# Patient Record
Sex: Female | Born: 1945
Health system: Southern US, Community
[De-identification: ages and names within clinical notes are randomized; demographics above are authoritative.]

## PROBLEM LIST (undated history)

## (undated) DIAGNOSIS — F32A Depression, unspecified: Secondary | ICD-10-CM

## (undated) DIAGNOSIS — H3530 Unspecified macular degeneration: Secondary | ICD-10-CM

## (undated) DIAGNOSIS — F99 Mental disorder, not otherwise specified: Secondary | ICD-10-CM

## (undated) DIAGNOSIS — E119 Type 2 diabetes mellitus without complications: Secondary | ICD-10-CM

## (undated) DIAGNOSIS — H409 Unspecified glaucoma: Secondary | ICD-10-CM

## (undated) DIAGNOSIS — I10 Essential (primary) hypertension: Secondary | ICD-10-CM

## (undated) DIAGNOSIS — F329 Major depressive disorder, single episode, unspecified: Secondary | ICD-10-CM

## (undated) DIAGNOSIS — E785 Hyperlipidemia, unspecified: Secondary | ICD-10-CM

## (undated) HISTORY — PX: APPENDECTOMY: SHX54

## (undated) HISTORY — PX: TONSILLECTOMY: SUR1361

## (undated) HISTORY — PX: EYE SURGERY: SHX253

---

## 1998-01-15 ENCOUNTER — Encounter: Admission: RE | Admit: 1998-01-15 | Discharge: 1998-04-15 | Payer: Self-pay | Admitting: Family Medicine

## 1998-01-22 ENCOUNTER — Ambulatory Visit (HOSPITAL_COMMUNITY): Admission: RE | Admit: 1998-01-22 | Discharge: 1998-01-22 | Payer: Self-pay | Admitting: Gastroenterology

## 1998-12-11 ENCOUNTER — Other Ambulatory Visit: Admission: RE | Admit: 1998-12-11 | Discharge: 1998-12-11 | Payer: Self-pay | Admitting: Obstetrics and Gynecology

## 1999-11-27 ENCOUNTER — Encounter: Admission: RE | Admit: 1999-11-27 | Discharge: 1999-11-27 | Payer: Self-pay | Admitting: Family Medicine

## 1999-11-27 ENCOUNTER — Encounter: Payer: Self-pay | Admitting: Family Medicine

## 1999-12-29 ENCOUNTER — Other Ambulatory Visit: Admission: RE | Admit: 1999-12-29 | Discharge: 1999-12-29 | Payer: Self-pay | Admitting: Obstetrics and Gynecology

## 2000-12-16 ENCOUNTER — Other Ambulatory Visit: Admission: RE | Admit: 2000-12-16 | Discharge: 2000-12-16 | Payer: Self-pay | Admitting: Obstetrics and Gynecology

## 2001-02-01 ENCOUNTER — Ambulatory Visit (HOSPITAL_COMMUNITY): Admission: RE | Admit: 2001-02-01 | Discharge: 2001-02-01 | Payer: Self-pay | Admitting: Gastroenterology

## 2001-02-01 ENCOUNTER — Encounter (INDEPENDENT_AMBULATORY_CARE_PROVIDER_SITE_OTHER): Payer: Self-pay | Admitting: Specialist

## 2001-12-21 ENCOUNTER — Other Ambulatory Visit: Admission: RE | Admit: 2001-12-21 | Discharge: 2001-12-21 | Payer: Self-pay | Admitting: Obstetrics and Gynecology

## 2004-08-28 ENCOUNTER — Other Ambulatory Visit: Admission: RE | Admit: 2004-08-28 | Discharge: 2004-08-28 | Payer: Self-pay | Admitting: Obstetrics and Gynecology

## 2006-04-15 ENCOUNTER — Encounter: Admission: RE | Admit: 2006-04-15 | Discharge: 2006-04-15 | Payer: Self-pay | Admitting: Family Medicine

## 2007-04-21 HISTORY — PX: TOTAL VAGINAL HYSTERECTOMY: SHX2548

## 2007-04-21 HISTORY — PX: VAGINAL HYSTERECTOMY: SUR661

## 2007-09-14 ENCOUNTER — Encounter: Admission: RE | Admit: 2007-09-14 | Discharge: 2007-12-13 | Payer: Self-pay | Admitting: Ophthalmology

## 2008-03-29 ENCOUNTER — Ambulatory Visit (HOSPITAL_COMMUNITY): Admission: RE | Admit: 2008-03-29 | Discharge: 2008-03-29 | Payer: Self-pay | Admitting: Obstetrics and Gynecology

## 2008-05-30 ENCOUNTER — Ambulatory Visit (HOSPITAL_COMMUNITY): Admission: RE | Admit: 2008-05-30 | Discharge: 2008-05-30 | Payer: Self-pay | Admitting: Family Medicine

## 2008-06-06 ENCOUNTER — Encounter: Admission: RE | Admit: 2008-06-06 | Discharge: 2008-06-06 | Payer: Self-pay | Admitting: Family Medicine

## 2010-09-02 NOTE — H&P (Signed)
NAME:  [name], [name]                 ACCOUNT NO.:  [id number]   MEDICAL RECORD NO.:  [id number]        PATIENT TYPE:  WAMB   LOCATION:                                FACILITY:  [facility]   PHYSICIAN:  Erika Salvia. Marcelle Robertson, M.D.DATE OF BIRTH:  [birth year]   DATE OF ADMISSION:  03/29/2008  DATE OF DISCHARGE:                              HISTORY & PHYSICAL   Date of scheduled surgery is 12/10.   CHIEF COMPLAINT:  SUI.   HISTORY OF PRESENT ILLNESS:  A 65 year old G3, P3.  This patient has had  a prior hysterectomy, has a 6-12 months history of urinary incontinence.  Recent urodynamics and hour study showed a residual of 10 mL.  She did  leak with a full bladder with Valsalva.  LPP 92-128 with an MUCP 30-32.  No uninhibited contractions and her uroflow pattern without the  catheters was normal.  Presents now for TOT and cystoscopy.  This  procedure including risks related to bleeding, infection, and adjacent  organ injury all reviewed with her.  Risks related to mesh erosion, the  need for postop catheterization and bladder irritability in the  immediate postop period all reviewed with her.   MEDICATIONS:  Lexapro 20 mg daily, Crestor, Xanax p.r.n., metformin.   Her medical doctor is Dr. Clovis Robertson.   REVIEW OF SYSTEMS:  Significant for a history of smoking.  She has been  stopped several years.   SURGERIES:  She has had tonsillectomy, appendectomy and hysterectomy 30  years ago.   Three vaginal deliveries at term.   ALLERGIES:  Sulfa and penicillin.   PHYSICAL EXAMINATION:  Temperature 98.2, blood pressure 120/83.  HEENT: Unremarkable.  NECK:  Supple without masses.  LUNGS:  Clear.  CARDIOVASCULAR:  Rate and rhythm without murmurs, rubs or gallops.  BREASTS:  Without masses.  ABDOMEN:  Soft, flat, nontender.  PELVIC EXAM:  Normal external genitalia.  Vaginal cuff clear.  Bimanual  negative.  EXTREMITIES AND  NEUROLOGIC EXAM:  Unremarkable.   IMPRESSION:  SUI.   PLAN:  TOT with  cystoscopy.  Procedure and risks reviewed as above.      [name], M.D.  Electronically Signed     RMH/MEDQ  D:  03/28/2008  T:  03/28/2008  Job:  [id number]

## 2010-09-02 NOTE — Op Note (Signed)
NAME:  [name], [name]                 ACCOUNT NO.:  [id number]   MEDICAL RECORD NO.:  [id number]          PATIENT TYPE:  AMB   LOCATION:  SDC                           FACILITY:  [facility]   PHYSICIAN:  Duke Salvia. Marcelle Overlie, M.D.DATE OF BIRTH:  1946/03/01   DATE OF PROCEDURE:  DATE OF DISCHARGE:                               OPERATIVE REPORT   PREOPERATIVE DIAGNOSIS:  Stress urinary incontinence.   POSTOPERATIVE DIAGNOSIS:  Stress urinary incontinence.   PROCEDURES:  1. Transobturator mid urethral sling.  2. Cystoscopy.   SURGEON:  Duke Salvia. Marcelle Overlie, MD   ANESTHESIA:  General.   COMPLICATIONS:  None.   DRAINS:  Foley catheter.   BLOOD LOSS:  Less than 50 mL.   SPECIMENS:  None.   PROCEDURE AND FINDINGS:  The patient was taken to the operating room  after an adequate level of general anesthesia was obtained.  With the  patient's legs in stirrups, the inner thighs, perineum, and vagina were  prepped and draped in usual manner for vaginal procedures.  The bladder  was drained.  A weighted speculum was positioned.  The needle sites for  the TOT were marked at the level of the clitoris just at the medial edge  of the inferior pubic ramus.  After these sites were marked, 1%  Xylocaine with epi was used to infiltrate the vaginal mucosa at the mid  urethral junction.  A 2-cm vertical incision was made, was dissected  minimally with sharp and blunt dissection until I could palpate the  posterior margin of the inferior pubic ramus on each side.  The  transobturator needle was then passed on each side.  Once the needles  were passed, cystoscopy was carried out.  The bladder was intact.  There  was a good ureteral jet on both sides.  No evidence of any bleeding or  trauma.  The scope was removed.  The bladder was deflated with the  catheter.  The mesh assembly was pulled through, was tensioned  appropriately, and trimmed.  The vaginal mucosa was closed with interrupted 2-0 Vicryl sutures.  Prior to closure, careful attention by observation to make sure there  was no buttonholing of the vaginal mucosa.  The needle sites were closed  with Dermabond.  She tolerated this well, went to recovery room in good  condition.      Richard M. Marcelle Overlie, M.D.  Electronically Signed     RMH/MEDQ  D:  03/29/2008  T:  03/29/2008  Job:  [id number]

## 2010-09-05 NOTE — Procedures (Signed)
[facility]  Patient:    [name], [name] Visit Number: [id number] MRN: [id number]          Service Type: END Location: ENDO Attending Physician:  Nelda Marseille Proc. Date: 02/01/01 Admit Date:  02/01/2001   CC:         Melvyn Neth D. Clovis Riley, MD   Procedure Report  PROCEDURE:  Colonoscopy with biopsy.  INDICATIONS:  Family history of colon cancer.  Personal history of colon polyps.  INFORMED CONSENT:  Consent was signed after risk, benefits, methods and options were thoroughly discussed in the past.  MEDICINES USED:  Demerol 70 mg, Versed 7.5 mg.  DESCRIPTION OF PROCEDURE:  Rectal inspection was pertinent for external hemorrhoids.  Digital exam was negative.  The video colonoscope was inserted and easily advanced around the colon to the cecum.  This did not require abdominal pressure or any position changes.  The cecum was identified by the appendiceal orifice and the ileocecal valve.  A tiny cecal polyp was seen and was cold biopsied x 3 in an effort to completely remove it.  The scope was further withdrawn.  Lots of washing and suctioning was done to get adequate visualization, but no other abnormalities were seen as we slowly withdrew back to the rectum.  Once back in the rectum, the scope was retroflexed, pertinent for some internal hemorrhoids.  The scope was straightened; air was suctioned and the scope removed.  The patient tolerated the procedure well, and there was no other obvious complication.  ENDOSCOPIC DIAGNOSES: 1. Internal/external hemorrhoids. 2. Tiny cecal polyp, cold biopsied. 3. Otherwise within normal limits to the cecum.  PLAN:  Yearly rectals and guaiacs per Dr. Clovis Riley.  Happy to see back p.r.n. but probably recheck screening in five years. Attending Physician:  Nelda Marseille DD:  02/01/01 TD:  02/01/01 Job: [id number]

## 2011-01-23 LAB — COMPREHENSIVE METABOLIC PANEL
ALT: 22 U/L (ref 0–35)
AST: 24 U/L (ref 0–37)
Albumin: 3.6 g/dL (ref 3.5–5.2)
Alkaline Phosphatase: 54 U/L (ref 39–117)
BUN: 11 mg/dL (ref 6–23)
CO2: 30 mEq/L (ref 19–32)
Calcium: 9.3 mg/dL (ref 8.4–10.5)
Chloride: 104 mEq/L (ref 96–112)
Creatinine, Ser: 0.56 mg/dL (ref 0.4–1.2)
GFR calc Af Amer: >60 mL/min (ref >60)
GFR calc non Af Amer: >60 mL/min (ref >60)
Glucose, Bld: 129 mg/dL — ABNORMAL HIGH (ref 70–99)
Potassium: 4.1 mEq/L (ref 3.5–5.1)
Sodium: 140 mEq/L (ref 135–145)
Total Bilirubin: 0.7 mg/dL (ref 0.3–1.2)
Total Protein: 6.6 g/dL (ref 6.0–8.3)

## 2011-01-23 LAB — CBC
HCT: 38.5 % (ref 36.0–46.0)
Hemoglobin: 13.1 g/dL (ref 12.0–15.0)
MCHC: 34 g/dL (ref 30.0–36.0)
MCV: 93.1 fL (ref 78.0–100.0)
Platelets: 226 10*3/uL (ref 150–400)
RBC: 4.13 MIL/uL (ref 3.87–5.11)
RDW: 13.4 % (ref 11.5–15.5)
WBC: 7.1 10*3/uL (ref 4.0–10.5)

## 2011-01-23 LAB — GLUCOSE, CAPILLARY: Glucose-Capillary: 196 mg/dL — ABNORMAL HIGH (ref 70–99)

## 2011-07-08 DIAGNOSIS — Z09 Encounter for follow-up examination after completed treatment for conditions other than malignant neoplasm: Secondary | ICD-10-CM | POA: Diagnosis not present

## 2011-07-08 DIAGNOSIS — D126 Benign neoplasm of colon, unspecified: Secondary | ICD-10-CM | POA: Diagnosis not present

## 2011-07-08 DIAGNOSIS — K591 Functional diarrhea: Secondary | ICD-10-CM | POA: Diagnosis not present

## 2011-07-08 DIAGNOSIS — Z86010 Personal history of colonic polyps: Secondary | ICD-10-CM | POA: Diagnosis not present

## 2011-07-08 DIAGNOSIS — Z800 Family history of malignant neoplasm of digestive organs: Secondary | ICD-10-CM | POA: Diagnosis not present

## 2011-07-09 DIAGNOSIS — D126 Benign neoplasm of colon, unspecified: Secondary | ICD-10-CM | POA: Diagnosis not present

## 2011-07-09 DIAGNOSIS — K591 Functional diarrhea: Secondary | ICD-10-CM | POA: Diagnosis not present

## 2011-07-14 DIAGNOSIS — E119 Type 2 diabetes mellitus without complications: Secondary | ICD-10-CM | POA: Diagnosis not present

## 2011-07-14 DIAGNOSIS — H353290 Exudative age-related macular degeneration, unspecified eye, stage unspecified: Secondary | ICD-10-CM | POA: Diagnosis not present

## 2011-07-28 DIAGNOSIS — H353290 Exudative age-related macular degeneration, unspecified eye, stage unspecified: Secondary | ICD-10-CM | POA: Diagnosis not present

## 2011-07-28 DIAGNOSIS — H35059 Retinal neovascularization, unspecified, unspecified eye: Secondary | ICD-10-CM | POA: Diagnosis not present

## 2011-08-24 DIAGNOSIS — H35059 Retinal neovascularization, unspecified, unspecified eye: Secondary | ICD-10-CM | POA: Diagnosis not present

## 2011-08-24 DIAGNOSIS — H409 Unspecified glaucoma: Secondary | ICD-10-CM | POA: Diagnosis not present

## 2011-08-24 DIAGNOSIS — H4011X0 Primary open-angle glaucoma, stage unspecified: Secondary | ICD-10-CM | POA: Diagnosis not present

## 2011-08-31 DIAGNOSIS — E669 Obesity, unspecified: Secondary | ICD-10-CM | POA: Diagnosis not present

## 2011-08-31 DIAGNOSIS — I10 Essential (primary) hypertension: Secondary | ICD-10-CM | POA: Diagnosis not present

## 2011-08-31 DIAGNOSIS — M159 Polyosteoarthritis, unspecified: Secondary | ICD-10-CM | POA: Diagnosis not present

## 2011-08-31 DIAGNOSIS — F329 Major depressive disorder, single episode, unspecified: Secondary | ICD-10-CM | POA: Diagnosis not present

## 2011-08-31 DIAGNOSIS — E7800 Pure hypercholesterolemia, unspecified: Secondary | ICD-10-CM | POA: Diagnosis not present

## 2011-08-31 DIAGNOSIS — E119 Type 2 diabetes mellitus without complications: Secondary | ICD-10-CM | POA: Diagnosis not present

## 2011-10-08 DIAGNOSIS — E119 Type 2 diabetes mellitus without complications: Secondary | ICD-10-CM | POA: Diagnosis not present

## 2011-10-08 DIAGNOSIS — F329 Major depressive disorder, single episode, unspecified: Secondary | ICD-10-CM | POA: Diagnosis not present

## 2011-10-08 DIAGNOSIS — H409 Unspecified glaucoma: Secondary | ICD-10-CM | POA: Diagnosis not present

## 2011-10-08 DIAGNOSIS — H353290 Exudative age-related macular degeneration, unspecified eye, stage unspecified: Secondary | ICD-10-CM | POA: Diagnosis not present

## 2011-10-08 DIAGNOSIS — F411 Generalized anxiety disorder: Secondary | ICD-10-CM | POA: Diagnosis not present

## 2011-10-08 DIAGNOSIS — D485 Neoplasm of uncertain behavior of skin: Secondary | ICD-10-CM | POA: Diagnosis not present

## 2011-10-08 DIAGNOSIS — M25519 Pain in unspecified shoulder: Secondary | ICD-10-CM | POA: Diagnosis not present

## 2011-10-08 DIAGNOSIS — I10 Essential (primary) hypertension: Secondary | ICD-10-CM | POA: Diagnosis not present

## 2011-10-12 DIAGNOSIS — F411 Generalized anxiety disorder: Secondary | ICD-10-CM | POA: Diagnosis not present

## 2011-10-12 DIAGNOSIS — H353290 Exudative age-related macular degeneration, unspecified eye, stage unspecified: Secondary | ICD-10-CM | POA: Diagnosis not present

## 2011-10-12 DIAGNOSIS — F329 Major depressive disorder, single episode, unspecified: Secondary | ICD-10-CM | POA: Diagnosis not present

## 2011-10-12 DIAGNOSIS — I10 Essential (primary) hypertension: Secondary | ICD-10-CM | POA: Diagnosis not present

## 2011-10-12 DIAGNOSIS — E119 Type 2 diabetes mellitus without complications: Secondary | ICD-10-CM | POA: Diagnosis not present

## 2011-10-12 DIAGNOSIS — H409 Unspecified glaucoma: Secondary | ICD-10-CM | POA: Diagnosis not present

## 2011-10-13 DIAGNOSIS — H353290 Exudative age-related macular degeneration, unspecified eye, stage unspecified: Secondary | ICD-10-CM | POA: Diagnosis not present

## 2011-10-13 DIAGNOSIS — H35359 Cystoid macular degeneration, unspecified eye: Secondary | ICD-10-CM | POA: Diagnosis not present

## 2011-10-13 DIAGNOSIS — H35059 Retinal neovascularization, unspecified, unspecified eye: Secondary | ICD-10-CM | POA: Diagnosis not present

## 2011-10-16 DIAGNOSIS — H353290 Exudative age-related macular degeneration, unspecified eye, stage unspecified: Secondary | ICD-10-CM | POA: Diagnosis not present

## 2011-10-16 DIAGNOSIS — E119 Type 2 diabetes mellitus without complications: Secondary | ICD-10-CM | POA: Diagnosis not present

## 2011-10-16 DIAGNOSIS — I10 Essential (primary) hypertension: Secondary | ICD-10-CM | POA: Diagnosis not present

## 2011-10-16 DIAGNOSIS — F329 Major depressive disorder, single episode, unspecified: Secondary | ICD-10-CM | POA: Diagnosis not present

## 2011-10-16 DIAGNOSIS — F411 Generalized anxiety disorder: Secondary | ICD-10-CM | POA: Diagnosis not present

## 2011-10-16 DIAGNOSIS — H409 Unspecified glaucoma: Secondary | ICD-10-CM | POA: Diagnosis not present

## 2011-10-19 DIAGNOSIS — E119 Type 2 diabetes mellitus without complications: Secondary | ICD-10-CM | POA: Diagnosis not present

## 2011-10-19 DIAGNOSIS — F329 Major depressive disorder, single episode, unspecified: Secondary | ICD-10-CM | POA: Diagnosis not present

## 2011-10-19 DIAGNOSIS — H409 Unspecified glaucoma: Secondary | ICD-10-CM | POA: Diagnosis not present

## 2011-10-19 DIAGNOSIS — I10 Essential (primary) hypertension: Secondary | ICD-10-CM | POA: Diagnosis not present

## 2011-10-19 DIAGNOSIS — F411 Generalized anxiety disorder: Secondary | ICD-10-CM | POA: Diagnosis not present

## 2011-10-19 DIAGNOSIS — H353290 Exudative age-related macular degeneration, unspecified eye, stage unspecified: Secondary | ICD-10-CM | POA: Diagnosis not present

## 2011-10-20 DIAGNOSIS — F329 Major depressive disorder, single episode, unspecified: Secondary | ICD-10-CM | POA: Diagnosis not present

## 2011-10-20 DIAGNOSIS — H409 Unspecified glaucoma: Secondary | ICD-10-CM | POA: Diagnosis not present

## 2011-10-20 DIAGNOSIS — E119 Type 2 diabetes mellitus without complications: Secondary | ICD-10-CM | POA: Diagnosis not present

## 2011-10-20 DIAGNOSIS — F411 Generalized anxiety disorder: Secondary | ICD-10-CM | POA: Diagnosis not present

## 2011-10-20 DIAGNOSIS — H353290 Exudative age-related macular degeneration, unspecified eye, stage unspecified: Secondary | ICD-10-CM | POA: Diagnosis not present

## 2011-10-20 DIAGNOSIS — I10 Essential (primary) hypertension: Secondary | ICD-10-CM | POA: Diagnosis not present

## 2011-10-21 DIAGNOSIS — F411 Generalized anxiety disorder: Secondary | ICD-10-CM | POA: Diagnosis not present

## 2011-10-21 DIAGNOSIS — I10 Essential (primary) hypertension: Secondary | ICD-10-CM | POA: Diagnosis not present

## 2011-10-21 DIAGNOSIS — H409 Unspecified glaucoma: Secondary | ICD-10-CM | POA: Diagnosis not present

## 2011-10-21 DIAGNOSIS — H353290 Exudative age-related macular degeneration, unspecified eye, stage unspecified: Secondary | ICD-10-CM | POA: Diagnosis not present

## 2011-10-21 DIAGNOSIS — E119 Type 2 diabetes mellitus without complications: Secondary | ICD-10-CM | POA: Diagnosis not present

## 2011-10-21 DIAGNOSIS — F329 Major depressive disorder, single episode, unspecified: Secondary | ICD-10-CM | POA: Diagnosis not present

## 2011-10-27 DIAGNOSIS — E119 Type 2 diabetes mellitus without complications: Secondary | ICD-10-CM | POA: Diagnosis not present

## 2011-10-27 DIAGNOSIS — F329 Major depressive disorder, single episode, unspecified: Secondary | ICD-10-CM | POA: Diagnosis not present

## 2011-10-27 DIAGNOSIS — I10 Essential (primary) hypertension: Secondary | ICD-10-CM | POA: Diagnosis not present

## 2011-10-27 DIAGNOSIS — H353290 Exudative age-related macular degeneration, unspecified eye, stage unspecified: Secondary | ICD-10-CM | POA: Diagnosis not present

## 2011-10-27 DIAGNOSIS — F411 Generalized anxiety disorder: Secondary | ICD-10-CM | POA: Diagnosis not present

## 2011-10-27 DIAGNOSIS — H409 Unspecified glaucoma: Secondary | ICD-10-CM | POA: Diagnosis not present

## 2011-10-29 DIAGNOSIS — H409 Unspecified glaucoma: Secondary | ICD-10-CM | POA: Diagnosis not present

## 2011-10-29 DIAGNOSIS — E119 Type 2 diabetes mellitus without complications: Secondary | ICD-10-CM | POA: Diagnosis not present

## 2011-10-29 DIAGNOSIS — H353290 Exudative age-related macular degeneration, unspecified eye, stage unspecified: Secondary | ICD-10-CM | POA: Diagnosis not present

## 2011-10-29 DIAGNOSIS — F329 Major depressive disorder, single episode, unspecified: Secondary | ICD-10-CM | POA: Diagnosis not present

## 2011-10-29 DIAGNOSIS — F411 Generalized anxiety disorder: Secondary | ICD-10-CM | POA: Diagnosis not present

## 2011-10-29 DIAGNOSIS — I10 Essential (primary) hypertension: Secondary | ICD-10-CM | POA: Diagnosis not present

## 2011-11-02 DIAGNOSIS — H409 Unspecified glaucoma: Secondary | ICD-10-CM | POA: Diagnosis not present

## 2011-11-02 DIAGNOSIS — I10 Essential (primary) hypertension: Secondary | ICD-10-CM | POA: Diagnosis not present

## 2011-11-02 DIAGNOSIS — F411 Generalized anxiety disorder: Secondary | ICD-10-CM | POA: Diagnosis not present

## 2011-11-02 DIAGNOSIS — E119 Type 2 diabetes mellitus without complications: Secondary | ICD-10-CM | POA: Diagnosis not present

## 2011-11-02 DIAGNOSIS — H353290 Exudative age-related macular degeneration, unspecified eye, stage unspecified: Secondary | ICD-10-CM | POA: Diagnosis not present

## 2011-11-02 DIAGNOSIS — F329 Major depressive disorder, single episode, unspecified: Secondary | ICD-10-CM | POA: Diagnosis not present

## 2011-11-04 DIAGNOSIS — E119 Type 2 diabetes mellitus without complications: Secondary | ICD-10-CM | POA: Diagnosis not present

## 2011-11-04 DIAGNOSIS — H353290 Exudative age-related macular degeneration, unspecified eye, stage unspecified: Secondary | ICD-10-CM | POA: Diagnosis not present

## 2011-11-04 DIAGNOSIS — H409 Unspecified glaucoma: Secondary | ICD-10-CM | POA: Diagnosis not present

## 2011-11-04 DIAGNOSIS — I10 Essential (primary) hypertension: Secondary | ICD-10-CM | POA: Diagnosis not present

## 2011-11-04 DIAGNOSIS — F411 Generalized anxiety disorder: Secondary | ICD-10-CM | POA: Diagnosis not present

## 2011-11-04 DIAGNOSIS — F329 Major depressive disorder, single episode, unspecified: Secondary | ICD-10-CM | POA: Diagnosis not present

## 2011-11-10 DIAGNOSIS — F411 Generalized anxiety disorder: Secondary | ICD-10-CM | POA: Diagnosis not present

## 2011-11-10 DIAGNOSIS — H353290 Exudative age-related macular degeneration, unspecified eye, stage unspecified: Secondary | ICD-10-CM | POA: Diagnosis not present

## 2011-11-10 DIAGNOSIS — F329 Major depressive disorder, single episode, unspecified: Secondary | ICD-10-CM | POA: Diagnosis not present

## 2011-11-10 DIAGNOSIS — H409 Unspecified glaucoma: Secondary | ICD-10-CM | POA: Diagnosis not present

## 2011-11-10 DIAGNOSIS — E119 Type 2 diabetes mellitus without complications: Secondary | ICD-10-CM | POA: Diagnosis not present

## 2011-11-10 DIAGNOSIS — I10 Essential (primary) hypertension: Secondary | ICD-10-CM | POA: Diagnosis not present

## 2011-11-13 DIAGNOSIS — E119 Type 2 diabetes mellitus without complications: Secondary | ICD-10-CM | POA: Diagnosis not present

## 2011-11-13 DIAGNOSIS — H353290 Exudative age-related macular degeneration, unspecified eye, stage unspecified: Secondary | ICD-10-CM | POA: Diagnosis not present

## 2011-11-13 DIAGNOSIS — I10 Essential (primary) hypertension: Secondary | ICD-10-CM | POA: Diagnosis not present

## 2011-11-13 DIAGNOSIS — H409 Unspecified glaucoma: Secondary | ICD-10-CM | POA: Diagnosis not present

## 2011-11-13 DIAGNOSIS — F329 Major depressive disorder, single episode, unspecified: Secondary | ICD-10-CM | POA: Diagnosis not present

## 2011-11-13 DIAGNOSIS — F411 Generalized anxiety disorder: Secondary | ICD-10-CM | POA: Diagnosis not present

## 2011-11-17 DIAGNOSIS — E119 Type 2 diabetes mellitus without complications: Secondary | ICD-10-CM | POA: Diagnosis not present

## 2011-11-17 DIAGNOSIS — I10 Essential (primary) hypertension: Secondary | ICD-10-CM | POA: Diagnosis not present

## 2011-11-17 DIAGNOSIS — H409 Unspecified glaucoma: Secondary | ICD-10-CM | POA: Diagnosis not present

## 2011-11-17 DIAGNOSIS — F329 Major depressive disorder, single episode, unspecified: Secondary | ICD-10-CM | POA: Diagnosis not present

## 2011-11-17 DIAGNOSIS — H353290 Exudative age-related macular degeneration, unspecified eye, stage unspecified: Secondary | ICD-10-CM | POA: Diagnosis not present

## 2011-11-17 DIAGNOSIS — F411 Generalized anxiety disorder: Secondary | ICD-10-CM | POA: Diagnosis not present

## 2011-11-19 DIAGNOSIS — H409 Unspecified glaucoma: Secondary | ICD-10-CM | POA: Diagnosis not present

## 2011-11-19 DIAGNOSIS — F411 Generalized anxiety disorder: Secondary | ICD-10-CM | POA: Diagnosis not present

## 2011-11-19 DIAGNOSIS — F329 Major depressive disorder, single episode, unspecified: Secondary | ICD-10-CM | POA: Diagnosis not present

## 2011-11-19 DIAGNOSIS — I10 Essential (primary) hypertension: Secondary | ICD-10-CM | POA: Diagnosis not present

## 2011-11-19 DIAGNOSIS — E119 Type 2 diabetes mellitus without complications: Secondary | ICD-10-CM | POA: Diagnosis not present

## 2011-11-19 DIAGNOSIS — H353290 Exudative age-related macular degeneration, unspecified eye, stage unspecified: Secondary | ICD-10-CM | POA: Diagnosis not present

## 2011-12-07 DIAGNOSIS — R159 Full incontinence of feces: Secondary | ICD-10-CM | POA: Diagnosis not present

## 2011-12-07 DIAGNOSIS — M25519 Pain in unspecified shoulder: Secondary | ICD-10-CM | POA: Diagnosis not present

## 2011-12-07 DIAGNOSIS — R300 Dysuria: Secondary | ICD-10-CM | POA: Diagnosis not present

## 2011-12-07 DIAGNOSIS — N3000 Acute cystitis without hematuria: Secondary | ICD-10-CM | POA: Diagnosis not present

## 2011-12-07 DIAGNOSIS — I10 Essential (primary) hypertension: Secondary | ICD-10-CM | POA: Diagnosis not present

## 2011-12-11 DIAGNOSIS — M67919 Unspecified disorder of synovium and tendon, unspecified shoulder: Secondary | ICD-10-CM | POA: Diagnosis not present

## 2011-12-11 DIAGNOSIS — M719 Bursopathy, unspecified: Secondary | ICD-10-CM | POA: Diagnosis not present

## 2011-12-16 DIAGNOSIS — M719 Bursopathy, unspecified: Secondary | ICD-10-CM | POA: Diagnosis not present

## 2011-12-16 DIAGNOSIS — M67919 Unspecified disorder of synovium and tendon, unspecified shoulder: Secondary | ICD-10-CM | POA: Diagnosis not present

## 2011-12-17 DIAGNOSIS — M719 Bursopathy, unspecified: Secondary | ICD-10-CM | POA: Diagnosis not present

## 2011-12-17 DIAGNOSIS — M67919 Unspecified disorder of synovium and tendon, unspecified shoulder: Secondary | ICD-10-CM | POA: Diagnosis not present

## 2011-12-18 DIAGNOSIS — H353290 Exudative age-related macular degeneration, unspecified eye, stage unspecified: Secondary | ICD-10-CM | POA: Diagnosis not present

## 2011-12-18 DIAGNOSIS — H35059 Retinal neovascularization, unspecified, unspecified eye: Secondary | ICD-10-CM | POA: Diagnosis not present

## 2011-12-23 DIAGNOSIS — M719 Bursopathy, unspecified: Secondary | ICD-10-CM | POA: Diagnosis not present

## 2011-12-25 DIAGNOSIS — M719 Bursopathy, unspecified: Secondary | ICD-10-CM | POA: Diagnosis not present

## 2011-12-28 DIAGNOSIS — M67919 Unspecified disorder of synovium and tendon, unspecified shoulder: Secondary | ICD-10-CM | POA: Diagnosis not present

## 2011-12-28 DIAGNOSIS — M719 Bursopathy, unspecified: Secondary | ICD-10-CM | POA: Diagnosis not present

## 2011-12-30 DIAGNOSIS — M719 Bursopathy, unspecified: Secondary | ICD-10-CM | POA: Diagnosis not present

## 2011-12-30 DIAGNOSIS — Z23 Encounter for immunization: Secondary | ICD-10-CM | POA: Diagnosis not present

## 2011-12-30 DIAGNOSIS — M67919 Unspecified disorder of synovium and tendon, unspecified shoulder: Secondary | ICD-10-CM | POA: Diagnosis not present

## 2012-01-04 DIAGNOSIS — M67919 Unspecified disorder of synovium and tendon, unspecified shoulder: Secondary | ICD-10-CM | POA: Diagnosis not present

## 2012-01-06 DIAGNOSIS — R159 Full incontinence of feces: Secondary | ICD-10-CM | POA: Diagnosis not present

## 2012-01-06 DIAGNOSIS — K529 Noninfective gastroenteritis and colitis, unspecified: Secondary | ICD-10-CM | POA: Diagnosis not present

## 2012-01-06 DIAGNOSIS — M719 Bursopathy, unspecified: Secondary | ICD-10-CM | POA: Diagnosis not present

## 2012-01-06 DIAGNOSIS — M67919 Unspecified disorder of synovium and tendon, unspecified shoulder: Secondary | ICD-10-CM | POA: Diagnosis not present

## 2012-01-06 DIAGNOSIS — R143 Flatulence: Secondary | ICD-10-CM | POA: Diagnosis not present

## 2012-01-06 DIAGNOSIS — R142 Eructation: Secondary | ICD-10-CM | POA: Diagnosis not present

## 2012-01-08 DIAGNOSIS — H35359 Cystoid macular degeneration, unspecified eye: Secondary | ICD-10-CM | POA: Diagnosis not present

## 2012-01-08 DIAGNOSIS — H4011X0 Primary open-angle glaucoma, stage unspecified: Secondary | ICD-10-CM | POA: Diagnosis not present

## 2012-01-08 DIAGNOSIS — E119 Type 2 diabetes mellitus without complications: Secondary | ICD-10-CM | POA: Diagnosis not present

## 2012-01-08 DIAGNOSIS — H353290 Exudative age-related macular degeneration, unspecified eye, stage unspecified: Secondary | ICD-10-CM | POA: Diagnosis not present

## 2012-01-08 DIAGNOSIS — H35059 Retinal neovascularization, unspecified, unspecified eye: Secondary | ICD-10-CM | POA: Diagnosis not present

## 2012-01-11 DIAGNOSIS — M719 Bursopathy, unspecified: Secondary | ICD-10-CM | POA: Diagnosis not present

## 2012-01-11 DIAGNOSIS — M67919 Unspecified disorder of synovium and tendon, unspecified shoulder: Secondary | ICD-10-CM | POA: Diagnosis not present

## 2012-01-15 DIAGNOSIS — M67919 Unspecified disorder of synovium and tendon, unspecified shoulder: Secondary | ICD-10-CM | POA: Diagnosis not present

## 2012-01-15 DIAGNOSIS — M719 Bursopathy, unspecified: Secondary | ICD-10-CM | POA: Diagnosis not present

## 2012-02-17 DIAGNOSIS — R141 Gas pain: Secondary | ICD-10-CM | POA: Diagnosis not present

## 2012-02-17 DIAGNOSIS — R159 Full incontinence of feces: Secondary | ICD-10-CM | POA: Diagnosis not present

## 2012-02-17 DIAGNOSIS — R143 Flatulence: Secondary | ICD-10-CM | POA: Diagnosis not present

## 2012-02-17 DIAGNOSIS — K529 Noninfective gastroenteritis and colitis, unspecified: Secondary | ICD-10-CM | POA: Diagnosis not present

## 2012-03-02 DIAGNOSIS — I10 Essential (primary) hypertension: Secondary | ICD-10-CM | POA: Diagnosis not present

## 2012-03-02 DIAGNOSIS — E119 Type 2 diabetes mellitus without complications: Secondary | ICD-10-CM | POA: Diagnosis not present

## 2012-03-02 DIAGNOSIS — M159 Polyosteoarthritis, unspecified: Secondary | ICD-10-CM | POA: Diagnosis not present

## 2012-03-02 DIAGNOSIS — E669 Obesity, unspecified: Secondary | ICD-10-CM | POA: Diagnosis not present

## 2012-03-02 DIAGNOSIS — F329 Major depressive disorder, single episode, unspecified: Secondary | ICD-10-CM | POA: Diagnosis not present

## 2012-03-02 DIAGNOSIS — E7800 Pure hypercholesterolemia, unspecified: Secondary | ICD-10-CM | POA: Diagnosis not present

## 2012-03-15 DIAGNOSIS — H353290 Exudative age-related macular degeneration, unspecified eye, stage unspecified: Secondary | ICD-10-CM | POA: Diagnosis not present

## 2012-03-15 DIAGNOSIS — E119 Type 2 diabetes mellitus without complications: Secondary | ICD-10-CM | POA: Diagnosis not present

## 2012-03-15 DIAGNOSIS — H35359 Cystoid macular degeneration, unspecified eye: Secondary | ICD-10-CM | POA: Diagnosis not present

## 2012-03-15 DIAGNOSIS — H35059 Retinal neovascularization, unspecified, unspecified eye: Secondary | ICD-10-CM | POA: Diagnosis not present

## 2012-03-15 DIAGNOSIS — H4011X0 Primary open-angle glaucoma, stage unspecified: Secondary | ICD-10-CM | POA: Diagnosis not present

## 2012-04-08 DIAGNOSIS — E119 Type 2 diabetes mellitus without complications: Secondary | ICD-10-CM | POA: Diagnosis not present

## 2012-04-08 DIAGNOSIS — H353290 Exudative age-related macular degeneration, unspecified eye, stage unspecified: Secondary | ICD-10-CM | POA: Diagnosis not present

## 2012-04-08 DIAGNOSIS — H4011X0 Primary open-angle glaucoma, stage unspecified: Secondary | ICD-10-CM | POA: Diagnosis not present

## 2012-04-08 DIAGNOSIS — H35359 Cystoid macular degeneration, unspecified eye: Secondary | ICD-10-CM | POA: Diagnosis not present

## 2012-04-08 DIAGNOSIS — H35059 Retinal neovascularization, unspecified, unspecified eye: Secondary | ICD-10-CM | POA: Diagnosis not present

## 2012-05-17 DIAGNOSIS — R142 Eructation: Secondary | ICD-10-CM | POA: Diagnosis not present

## 2012-05-17 DIAGNOSIS — K529 Noninfective gastroenteritis and colitis, unspecified: Secondary | ICD-10-CM | POA: Diagnosis not present

## 2012-05-17 DIAGNOSIS — R141 Gas pain: Secondary | ICD-10-CM | POA: Diagnosis not present

## 2012-05-17 DIAGNOSIS — R159 Full incontinence of feces: Secondary | ICD-10-CM | POA: Diagnosis not present

## 2012-05-23 DIAGNOSIS — H3530 Unspecified macular degeneration: Secondary | ICD-10-CM | POA: Diagnosis not present

## 2012-05-23 DIAGNOSIS — H4011X0 Primary open-angle glaucoma, stage unspecified: Secondary | ICD-10-CM | POA: Diagnosis not present

## 2012-05-27 DIAGNOSIS — H35359 Cystoid macular degeneration, unspecified eye: Secondary | ICD-10-CM | POA: Diagnosis not present

## 2012-05-27 DIAGNOSIS — H353290 Exudative age-related macular degeneration, unspecified eye, stage unspecified: Secondary | ICD-10-CM | POA: Diagnosis not present

## 2012-05-27 DIAGNOSIS — E119 Type 2 diabetes mellitus without complications: Secondary | ICD-10-CM | POA: Diagnosis not present

## 2012-05-27 DIAGNOSIS — H35059 Retinal neovascularization, unspecified, unspecified eye: Secondary | ICD-10-CM | POA: Diagnosis not present

## 2012-05-27 DIAGNOSIS — H4011X0 Primary open-angle glaucoma, stage unspecified: Secondary | ICD-10-CM | POA: Diagnosis not present

## 2012-07-26 DIAGNOSIS — H353290 Exudative age-related macular degeneration, unspecified eye, stage unspecified: Secondary | ICD-10-CM | POA: Diagnosis not present

## 2012-08-09 DIAGNOSIS — H35059 Retinal neovascularization, unspecified, unspecified eye: Secondary | ICD-10-CM | POA: Diagnosis not present

## 2012-08-09 DIAGNOSIS — H353290 Exudative age-related macular degeneration, unspecified eye, stage unspecified: Secondary | ICD-10-CM | POA: Diagnosis not present

## 2012-08-09 DIAGNOSIS — H35359 Cystoid macular degeneration, unspecified eye: Secondary | ICD-10-CM | POA: Diagnosis not present

## 2012-08-09 DIAGNOSIS — H4011X0 Primary open-angle glaucoma, stage unspecified: Secondary | ICD-10-CM | POA: Diagnosis not present

## 2012-08-09 DIAGNOSIS — E119 Type 2 diabetes mellitus without complications: Secondary | ICD-10-CM | POA: Diagnosis not present

## 2012-08-30 DIAGNOSIS — F329 Major depressive disorder, single episode, unspecified: Secondary | ICD-10-CM | POA: Diagnosis not present

## 2012-08-30 DIAGNOSIS — Z23 Encounter for immunization: Secondary | ICD-10-CM | POA: Diagnosis not present

## 2012-08-30 DIAGNOSIS — E669 Obesity, unspecified: Secondary | ICD-10-CM | POA: Diagnosis not present

## 2012-08-30 DIAGNOSIS — E119 Type 2 diabetes mellitus without complications: Secondary | ICD-10-CM | POA: Diagnosis not present

## 2012-08-30 DIAGNOSIS — I10 Essential (primary) hypertension: Secondary | ICD-10-CM | POA: Diagnosis not present

## 2012-08-30 DIAGNOSIS — L723 Sebaceous cyst: Secondary | ICD-10-CM | POA: Diagnosis not present

## 2012-08-30 DIAGNOSIS — M159 Polyosteoarthritis, unspecified: Secondary | ICD-10-CM | POA: Diagnosis not present

## 2012-08-30 DIAGNOSIS — E7800 Pure hypercholesterolemia, unspecified: Secondary | ICD-10-CM | POA: Diagnosis not present

## 2012-09-07 DIAGNOSIS — M67919 Unspecified disorder of synovium and tendon, unspecified shoulder: Secondary | ICD-10-CM | POA: Diagnosis not present

## 2012-09-07 DIAGNOSIS — M19019 Primary osteoarthritis, unspecified shoulder: Secondary | ICD-10-CM | POA: Diagnosis not present

## 2012-09-07 DIAGNOSIS — M719 Bursopathy, unspecified: Secondary | ICD-10-CM | POA: Diagnosis not present

## 2012-09-09 DIAGNOSIS — R159 Full incontinence of feces: Secondary | ICD-10-CM | POA: Diagnosis not present

## 2012-09-09 DIAGNOSIS — R141 Gas pain: Secondary | ICD-10-CM | POA: Diagnosis not present

## 2012-10-03 DIAGNOSIS — M19019 Primary osteoarthritis, unspecified shoulder: Secondary | ICD-10-CM | POA: Diagnosis not present

## 2012-11-04 DIAGNOSIS — H353290 Exudative age-related macular degeneration, unspecified eye, stage unspecified: Secondary | ICD-10-CM | POA: Diagnosis not present

## 2012-11-10 DIAGNOSIS — L723 Sebaceous cyst: Secondary | ICD-10-CM | POA: Diagnosis not present

## 2012-11-10 DIAGNOSIS — L719 Rosacea, unspecified: Secondary | ICD-10-CM | POA: Diagnosis not present

## 2012-11-28 DIAGNOSIS — H409 Unspecified glaucoma: Secondary | ICD-10-CM | POA: Diagnosis not present

## 2012-11-28 DIAGNOSIS — H4011X0 Primary open-angle glaucoma, stage unspecified: Secondary | ICD-10-CM | POA: Diagnosis not present

## 2012-11-28 DIAGNOSIS — H4010X0 Unspecified open-angle glaucoma, stage unspecified: Secondary | ICD-10-CM | POA: Diagnosis not present

## 2012-12-01 DIAGNOSIS — Z124 Encounter for screening for malignant neoplasm of cervix: Secondary | ICD-10-CM | POA: Diagnosis not present

## 2012-12-01 DIAGNOSIS — Z1231 Encounter for screening mammogram for malignant neoplasm of breast: Secondary | ICD-10-CM | POA: Diagnosis not present

## 2012-12-02 DIAGNOSIS — H35059 Retinal neovascularization, unspecified, unspecified eye: Secondary | ICD-10-CM | POA: Diagnosis not present

## 2012-12-02 DIAGNOSIS — E119 Type 2 diabetes mellitus without complications: Secondary | ICD-10-CM | POA: Diagnosis not present

## 2012-12-02 DIAGNOSIS — H4011X0 Primary open-angle glaucoma, stage unspecified: Secondary | ICD-10-CM | POA: Diagnosis not present

## 2012-12-02 DIAGNOSIS — H353290 Exudative age-related macular degeneration, unspecified eye, stage unspecified: Secondary | ICD-10-CM | POA: Diagnosis not present

## 2012-12-02 DIAGNOSIS — H35359 Cystoid macular degeneration, unspecified eye: Secondary | ICD-10-CM | POA: Diagnosis not present

## 2013-01-03 DIAGNOSIS — H35059 Retinal neovascularization, unspecified, unspecified eye: Secondary | ICD-10-CM | POA: Diagnosis not present

## 2013-01-03 DIAGNOSIS — H35359 Cystoid macular degeneration, unspecified eye: Secondary | ICD-10-CM | POA: Diagnosis not present

## 2013-01-03 DIAGNOSIS — H353290 Exudative age-related macular degeneration, unspecified eye, stage unspecified: Secondary | ICD-10-CM | POA: Diagnosis not present

## 2013-01-31 DIAGNOSIS — H35059 Retinal neovascularization, unspecified, unspecified eye: Secondary | ICD-10-CM | POA: Diagnosis not present

## 2013-01-31 DIAGNOSIS — H35359 Cystoid macular degeneration, unspecified eye: Secondary | ICD-10-CM | POA: Diagnosis not present

## 2013-01-31 DIAGNOSIS — H353290 Exudative age-related macular degeneration, unspecified eye, stage unspecified: Secondary | ICD-10-CM | POA: Diagnosis not present

## 2013-02-13 ENCOUNTER — Encounter (HOSPITAL_COMMUNITY): Payer: Self-pay | Admitting: Pharmacist

## 2013-02-20 NOTE — H&P (Signed)
[name]  DICTATION # [id number] CSN# [id number]   Meriel Pica, MD 02/20/2013 1:27 PM

## 2013-02-21 DIAGNOSIS — L719 Rosacea, unspecified: Secondary | ICD-10-CM | POA: Diagnosis not present

## 2013-02-21 DIAGNOSIS — L723 Sebaceous cyst: Secondary | ICD-10-CM | POA: Diagnosis not present

## 2013-02-21 DIAGNOSIS — N8110 Cystocele, unspecified: Secondary | ICD-10-CM | POA: Diagnosis not present

## 2013-02-24 DIAGNOSIS — F329 Major depressive disorder, single episode, unspecified: Secondary | ICD-10-CM | POA: Diagnosis not present

## 2013-02-24 DIAGNOSIS — Z23 Encounter for immunization: Secondary | ICD-10-CM | POA: Diagnosis not present

## 2013-02-24 DIAGNOSIS — E7800 Pure hypercholesterolemia, unspecified: Secondary | ICD-10-CM | POA: Diagnosis not present

## 2013-02-24 DIAGNOSIS — E119 Type 2 diabetes mellitus without complications: Secondary | ICD-10-CM | POA: Diagnosis not present

## 2013-02-24 DIAGNOSIS — M159 Polyosteoarthritis, unspecified: Secondary | ICD-10-CM | POA: Diagnosis not present

## 2013-02-24 DIAGNOSIS — I10 Essential (primary) hypertension: Secondary | ICD-10-CM | POA: Diagnosis not present

## 2013-02-24 NOTE — H&P (Signed)
Erika, Robertson NO.:  [id number]  MEDICAL RECORD NO.:  [id number]  LOCATION:                                 FACILITY:  PHYSICIAN:  Duke Salvia. Marcelle Overlie, M.D.DATE OF BIRTH:  05-Dec-1945  DATE OF ADMISSION: DATE OF DISCHARGE:                             HISTORY & PHYSICAL   CHIEF COMPLAINT:  Rectal pressure/rectocele.  HISTORY OF PRESENT ILLNESS:  A 67 year old, who has had a prior TBH, also a TOT in 2009, on a separate occasion.  She sees both Dr. Clovis Riley, her PCP and Dr. Ewing Schlein, her GI doctor.  She had seen Dr. Ewing Schlein, recently complaining of some rectal pressure who felt that perhaps she might have a rectocele and asked her to come back to be re-evaluated.  I last saw her in 2010.  At her most recent evaluation was noted to have moderate rectocele noted.  The anterior support was good as was the cuff support, otherwise negative, presents now for rectocele repair.  This procedure including specific risks related to bleeding, infection, adjacent organ injury, along with her expected recovery time discussed, which she understands and accepts.  CURRENT MEDICATIONS: 1. Glimepiride 2 mg once daily. 2. Metformin 500 mg 4 times per day. 3. Crestor 20 mg daily. 4. Lexapro 20 mg daily. 5. Xanax p.r.n. 6. Timolol eye drops.  PRIOR SURGERY:  TBH, TOT, 3 vaginal deliveries, appendectomy, tonsillectomy.  ALLERGIES:  SULFA, PENICILLIN, CODEINE.  SOCIAL HISTORY:  Denies alcohol, tobacco, or drug use.  She is widowed. Dr. Lupe Carney is her medical doctor.  FAMILY HISTORY:  Significant for diabetes and unspecified cancer.  PHYSICAL EXAMINATION:  VITAL SIGNS:  Temperature 98.2, blood pressure 130/78. HEENT:  Unremarkable. NECK:  Supple without masses. LUNGS:  Clear. CARDIOVASCULAR:  Regular rate and rhythm without murmurs, rubs, or gallops. BREASTS:  Without masses. ABDOMEN:  Soft, flat, nontender. GENITOURINARY:  Vulvovagina unremarkable, moderate  rectocele was noted. The anterior supporting cuffs were good.  Bimanual otherwise negative. EXTREMITIES:  Unremarkable. NEUROLOGIC:  Unremarkable.  IMPRESSION:  Symptomatic rectocele.  PLAN:  Posterior repair.  Procedure and risks discussed as above.     Norris Brumbach M. Marcelle Overlie, M.D.     RMH/MEDQ  D:  02/20/2013  T:  02/21/2013  Job:  [id number]

## 2013-02-27 ENCOUNTER — Encounter (HOSPITAL_COMMUNITY)
Admission: RE | Admit: 2013-02-27 | Discharge: 2013-02-27 | Disposition: A | Discharge status: Home / Self Care | Payer: Medicare Other | Source: Ambulatory Visit | Attending: Obstetrics and Gynecology | Admitting: Obstetrics and Gynecology

## 2013-02-27 ENCOUNTER — Encounter (HOSPITAL_COMMUNITY): Payer: Self-pay

## 2013-02-27 DIAGNOSIS — N993 Prolapse of vaginal vault after hysterectomy: Secondary | ICD-10-CM | POA: Diagnosis not present

## 2013-02-27 HISTORY — DX: Hyperlipidemia, unspecified: E78.5

## 2013-02-27 HISTORY — DX: Major depressive disorder, single episode, unspecified: F32.9

## 2013-02-27 HISTORY — DX: Type 2 diabetes mellitus without complications: E11.9

## 2013-02-27 HISTORY — DX: Unspecified macular degeneration: H35.30

## 2013-02-27 HISTORY — DX: Unspecified glaucoma: H40.9

## 2013-02-27 HISTORY — DX: Essential (primary) hypertension: I10

## 2013-02-27 HISTORY — DX: Mental disorder, not otherwise specified: F99

## 2013-02-27 HISTORY — DX: Depression, unspecified: F32.A

## 2013-02-27 LAB — BASIC METABOLIC PANEL
CO2: 29 mEq/L (ref 19–32)
Calcium: 9.8 mg/dL (ref 8.4–10.5)
Chloride: 101 mEq/L (ref 96–112)
Glucose, Bld: 152 mg/dL — ABNORMAL HIGH (ref 70–99)
Sodium: 138 mEq/L (ref 135–145)

## 2013-02-27 LAB — CBC
HCT: 34.2 % — ABNORMAL LOW (ref 36.0–46.0)
MCH: 29.6 pg (ref 26.0–34.0)
MCV: 88.1 fL (ref 78.0–100.0)
RBC: 3.88 MIL/uL (ref 3.87–5.11)
WBC: 6.4 10*3/uL (ref 4.0–10.5)

## 2013-02-27 NOTE — Patient Instructions (Addendum)
   Your procedure is scheduled on: Thursday, Nov 13  Enter through the [employer] of [facility] at: 6 AM Pick up the phone at the desk and dial [phone] and inform us of your arrival.  Please call this number if you have any problems the morning of surgery: [phone]  Remember: Do not eat or drink after midnight: Wednesday Take these medicines the morning of surgery with a SIP OF WATER:  Crestor, lisinopril-hctz,  zoloft, xanax if needed.  Patient instructed to withhold Metformin Wednesday night dose and Thursday morning dose-day of surgery. Patient to withhold glimepiride Thursday morning dose -day of surgery.   Do not wear jewelry, make-up, or FINGER nail polish No metal in your hair or on your body. Do not wear lotions, powders, perfumes. You may wear deodorant.  Please use your CHG wash as directed prior to surgery.  Do not shave anywhere for at least 12 hours prior to first CHG shower.  Do not bring valuables to the hospital. Contacts, dentures or bridgework may not be worn into surgery.  Leave suitcase in the car. After Surgery it may be brought to your room. For patients being admitted to the hospital, checkout time is 11:00am the day of discharge.  Patients discharged on the day of surgery will not be allowed to drive home.  Home with daughter Waldon Reining  cell [phone].

## 2013-03-01 MED ORDER — GENTAMICIN SULFATE 40 MG/ML IJ SOLN
INTRAMUSCULAR | Status: AC
  Administered 2013-03-02: 100 mL via INTRAVENOUS
  Filled 2013-03-01: qty 12.25

## 2013-03-01 NOTE — Anesthesia Preprocedure Evaluation (Addendum)
Anesthesia Evaluation  Patient identified by MRN, date of birth, ID band Patient awake    Reviewed: Allergy & Precautions, H&P , NPO status , Patient's Chart, lab work & pertinent test results  Airway Mallampati: II TM Distance: >3 FB Neck ROM: Full    Dental  (+) Dental Advisory Given, Edentulous Upper and Upper Dentures   Pulmonary neg pulmonary ROS, former smoker,  breath sounds clear to auscultation        Cardiovascular hypertension, Pt. on medications negative cardio ROS  Rhythm:Regular Rate:Normal     Neuro/Psych PSYCHIATRIC DISORDERS Depression negative neurological ROS     GI/Hepatic negative GI ROS, Neg liver ROS,   Endo/Other  diabetes, Type 2, Oral Hypoglycemic AgentsMorbid obesity  Renal/GU negative Renal ROS     Musculoskeletal negative musculoskeletal ROS (+)   Abdominal (+) + obese,   Peds  Hematology negative hematology ROS (+)   Anesthesia Other Findings   Reproductive/Obstetrics negative OB ROS                        Anesthesia Physical Anesthesia Plan  ASA: III  Anesthesia Plan: Spinal   Post-op Pain Management:    Induction: Intravenous  Airway Management Planned: Natural Airway  Additional Equipment:   Intra-op Plan:   Post-operative Plan: Extubation in OR  Informed Consent: I have reviewed the patients History and Physical, chart, labs and discussed the procedure including the risks, benefits and alternatives for the proposed anesthesia with the patient or authorized representative who has indicated his/her understanding and acceptance.   Dental advisory given  Plan Discussed with: CRNA, Anesthesiologist and Surgeon  Anesthesia Plan Comments:       Anesthesia Quick Evaluation

## 2013-03-02 ENCOUNTER — Encounter (HOSPITAL_COMMUNITY)
Admission: RE | Disposition: A | Discharge status: Home / Self Care | Payer: Self-pay | Source: Ambulatory Visit | Attending: Obstetrics and Gynecology

## 2013-03-02 ENCOUNTER — Encounter (HOSPITAL_COMMUNITY): Payer: Self-pay | Admitting: *Deleted

## 2013-03-02 ENCOUNTER — Observation Stay (HOSPITAL_COMMUNITY)
Admission: RE | Admit: 2013-03-02 | Discharge: 2013-03-03 | Disposition: A | Discharge status: Home / Self Care | Payer: Medicare Other | Source: Ambulatory Visit | Attending: Obstetrics and Gynecology | Admitting: Obstetrics and Gynecology

## 2013-03-02 ENCOUNTER — Encounter (HOSPITAL_COMMUNITY): Payer: Medicare Other | Admitting: Anesthesiology

## 2013-03-02 ENCOUNTER — Ambulatory Visit (HOSPITAL_COMMUNITY): Payer: Medicare Other | Admitting: Anesthesiology

## 2013-03-02 DIAGNOSIS — N993 Prolapse of vaginal vault after hysterectomy: Principal | ICD-10-CM | POA: Insufficient documentation

## 2013-03-02 DIAGNOSIS — N816 Rectocele: Secondary | ICD-10-CM | POA: Diagnosis not present

## 2013-03-02 HISTORY — PX: RECTOCELE REPAIR: SHX761

## 2013-03-02 LAB — GLUCOSE, CAPILLARY: Glucose-Capillary: 128 mg/dL — ABNORMAL HIGH (ref 70–99)

## 2013-03-02 SURGERY — COLPORRHAPHY, POSTERIOR, FOR RECTOCELE REPAIR
Anesthesia: Spinal | Site: Vagina | Wound class: Clean Contaminated

## 2013-03-02 MED ORDER — FENTANYL CITRATE 0.05 MG/ML IJ SOLN
INTRAMUSCULAR | Status: AC
  Filled 2013-03-02: qty 2

## 2013-03-02 MED ORDER — LIDOCAINE-EPINEPHRINE 1 %-1:100000 IJ SOLN
INTRAMUSCULAR | Status: DC | PRN
  Administered 2013-03-02: 8 mL

## 2013-03-02 MED ORDER — FENTANYL CITRATE 0.05 MG/ML IJ SOLN
INTRAMUSCULAR | Status: DC | PRN
  Administered 2013-03-02 (×2): 25 ug via INTRAVENOUS

## 2013-03-02 MED ORDER — DIPHENHYDRAMINE HCL 50 MG/ML IJ SOLN: 12.5000 mg | Freq: Four times a day (QID) | INTRAMUSCULAR | Status: DC | PRN

## 2013-03-02 MED ORDER — KETOROLAC TROMETHAMINE 30 MG/ML IJ SOLN: 30.0000 mg | Freq: Four times a day (QID) | INTRAMUSCULAR | Status: DC

## 2013-03-02 MED ORDER — BUTORPHANOL TARTRATE 1 MG/ML IJ SOLN: 1.0000 mg | INTRAMUSCULAR | Status: DC | PRN

## 2013-03-02 MED ORDER — MORPHINE SULFATE (PF) 1 MG/ML IV SOLN
INTRAVENOUS | Status: DC
  Filled 2013-03-02: qty 25

## 2013-03-02 MED ORDER — PROPOFOL 10 MG/ML IV BOLUS
INTRAVENOUS | Status: DC | PRN
  Administered 2013-03-02 (×2): 10 mg via INTRAVENOUS

## 2013-03-02 MED ORDER — TIMOLOL MALEATE 0.5 % OP SOLN: 1.0000 [drp] | Freq: Every day | OPHTHALMIC | Status: DC

## 2013-03-02 MED ORDER — MIDAZOLAM HCL 2 MG/2ML IJ SOLN
INTRAMUSCULAR | Status: DC | PRN
  Administered 2013-03-02: 2 mg via INTRAVENOUS

## 2013-03-02 MED ORDER — SODIUM CHLORIDE 0.9 % IJ SOLN: 9.0000 mL | INTRAMUSCULAR | Status: DC | PRN

## 2013-03-02 MED ORDER — KETOROLAC TROMETHAMINE 30 MG/ML IJ SOLN
15.0000 mg | Freq: Four times a day (QID) | INTRAMUSCULAR | Status: DC
  Administered 2013-03-03: 15 mg via INTRAVENOUS
  Filled 2013-03-02: qty 1

## 2013-03-02 MED ORDER — ONDANSETRON HCL 4 MG/2ML IJ SOLN: 4.0000 mg | Freq: Four times a day (QID) | INTRAMUSCULAR | Status: DC | PRN

## 2013-03-02 MED ORDER — DIPHENHYDRAMINE HCL 50 MG/ML IJ SOLN: 12.5000 mg | INTRAMUSCULAR | Status: DC | PRN

## 2013-03-02 MED ORDER — ROCURONIUM BROMIDE 100 MG/10ML IV SOLN
INTRAVENOUS | Status: AC
  Filled 2013-03-02: qty 1

## 2013-03-02 MED ORDER — SCOPOLAMINE 1 MG/3DAYS TD PT72
1.0000 | MEDICATED_PATCH | TRANSDERMAL | Status: DC
  Administered 2013-03-02: 1.5 mg via TRANSDERMAL

## 2013-03-02 MED ORDER — IBUPROFEN 800 MG PO TABS: 800.0000 mg | ORAL_TABLET | Freq: Three times a day (TID) | ORAL | Status: DC | PRN

## 2013-03-02 MED ORDER — DEXTROSE IN LACTATED RINGERS 5 % IV SOLN
INTRAVENOUS | Status: DC
  Administered 2013-03-02: 100 mL/h via INTRAVENOUS
  Administered 2013-03-02: 22:00:00 via INTRAVENOUS

## 2013-03-02 MED ORDER — LIDOCAINE HCL (CARDIAC) 20 MG/ML IV SOLN
INTRAVENOUS | Status: AC
  Filled 2013-03-02: qty 5

## 2013-03-02 MED ORDER — GLYCOPYRROLATE 0.2 MG/ML IJ SOLN
INTRAMUSCULAR | Status: AC
  Filled 2013-03-02: qty 2

## 2013-03-02 MED ORDER — FENTANYL CITRATE 0.05 MG/ML IJ SOLN: 25.0000 ug | INTRAMUSCULAR | Status: DC | PRN

## 2013-03-02 MED ORDER — METOCLOPRAMIDE HCL 5 MG/ML IJ SOLN: 10.0000 mg | Freq: Three times a day (TID) | INTRAMUSCULAR | Status: DC | PRN

## 2013-03-02 MED ORDER — DIPHENHYDRAMINE HCL 50 MG/ML IJ SOLN: 25.0000 mg | INTRAMUSCULAR | Status: DC | PRN

## 2013-03-02 MED ORDER — PROPOFOL 10 MG/ML IV EMUL
INTRAVENOUS | Status: AC
  Filled 2013-03-02: qty 20

## 2013-03-02 MED ORDER — ONDANSETRON HCL 4 MG PO TABS: 4.0000 mg | ORAL_TABLET | Freq: Four times a day (QID) | ORAL | Status: DC | PRN

## 2013-03-02 MED ORDER — ALPRAZOLAM 0.5 MG PO TABS
0.5000 mg | ORAL_TABLET | Freq: Two times a day (BID) | ORAL | Status: DC
  Administered 2013-03-02: 0.5 mg via ORAL
  Filled 2013-03-02: qty 1

## 2013-03-02 MED ORDER — KETOROLAC TROMETHAMINE 30 MG/ML IJ SOLN
INTRAMUSCULAR | Status: AC
  Filled 2013-03-02: qty 2

## 2013-03-02 MED ORDER — SERTRALINE HCL 100 MG PO TABS
100.0000 mg | ORAL_TABLET | Freq: Every day | ORAL | Status: DC
  Administered 2013-03-03: 100 mg via ORAL
  Filled 2013-03-02 (×3): qty 1

## 2013-03-02 MED ORDER — FENTANYL CITRATE 0.05 MG/ML IJ SOLN
INTRAMUSCULAR | Status: AC
  Filled 2013-03-02: qty 5

## 2013-03-02 MED ORDER — BUPIVACAINE IN DEXTROSE 0.75-8.25 % IT SOLN
INTRATHECAL | Status: DC | PRN
  Administered 2013-03-02: 1.4 mL via INTRATHECAL

## 2013-03-02 MED ORDER — KETOROLAC TROMETHAMINE 30 MG/ML IJ SOLN: 15.0000 mg | Freq: Four times a day (QID) | INTRAMUSCULAR | Status: DC

## 2013-03-02 MED ORDER — LISINOPRIL 20 MG PO TABS
20.0000 mg | ORAL_TABLET | Freq: Every day | ORAL | Status: DC
  Administered 2013-03-03: 20 mg via ORAL
  Filled 2013-03-02 (×2): qty 1

## 2013-03-02 MED ORDER — KETOROLAC TROMETHAMINE 30 MG/ML IJ SOLN: 30.0000 mg | Freq: Once | INTRAMUSCULAR | Status: DC

## 2013-03-02 MED ORDER — METOCLOPRAMIDE HCL 5 MG/ML IJ SOLN
INTRAMUSCULAR | Status: AC
  Administered 2013-03-02: 10 mg via INTRAVENOUS
  Filled 2013-03-02: qty 2

## 2013-03-02 MED ORDER — LIDOCAINE HCL (CARDIAC) 20 MG/ML IV SOLN
INTRAVENOUS | Status: DC | PRN
  Administered 2013-03-02: 40 mg via INTRAVENOUS

## 2013-03-02 MED ORDER — SCOPOLAMINE 1 MG/3DAYS TD PT72: 1.0000 | MEDICATED_PATCH | Freq: Once | TRANSDERMAL | Status: DC

## 2013-03-02 MED ORDER — ONDANSETRON HCL 4 MG/2ML IJ SOLN: 4.0000 mg | Freq: Three times a day (TID) | INTRAMUSCULAR | Status: DC | PRN

## 2013-03-02 MED ORDER — MEPERIDINE HCL 25 MG/ML IJ SOLN: 6.2500 mg | INTRAMUSCULAR | Status: DC | PRN

## 2013-03-02 MED ORDER — DIPHENHYDRAMINE HCL 50 MG/ML IJ SOLN
12.5000 mg | Freq: Once | INTRAMUSCULAR | Status: AC
  Administered 2013-03-02: 12.5 mg via INTRAVENOUS

## 2013-03-02 MED ORDER — METFORMIN HCL 500 MG PO TABS
1000.0000 mg | ORAL_TABLET | Freq: Two times a day (BID) | ORAL | Status: DC
  Administered 2013-03-02 – 2013-03-03 (×2): 1000 mg via ORAL
  Filled 2013-03-02 (×4): qty 2

## 2013-03-02 MED ORDER — LIDOCAINE-EPINEPHRINE 1 %-1:100000 IJ SOLN
INTRAMUSCULAR | Status: AC
  Filled 2013-03-02: qty 1

## 2013-03-02 MED ORDER — EPHEDRINE SULFATE 50 MG/ML IJ SOLN
INTRAMUSCULAR | Status: DC | PRN
  Administered 2013-03-02: 10 mg via INTRAVENOUS
  Administered 2013-03-02 (×3): 5 mg via INTRAVENOUS

## 2013-03-02 MED ORDER — NALOXONE HCL 1 MG/ML IJ SOLN
1.0000 ug/kg/h | INTRAVENOUS | Status: DC | PRN
  Filled 2013-03-02: qty 2

## 2013-03-02 MED ORDER — LACTATED RINGERS IV SOLN
INTRAVENOUS | Status: DC | PRN
  Administered 2013-03-02 (×2): via INTRAVENOUS

## 2013-03-02 MED ORDER — MIDAZOLAM HCL 2 MG/2ML IJ SOLN
INTRAMUSCULAR | Status: AC
  Filled 2013-03-02: qty 2

## 2013-03-02 MED ORDER — ONDANSETRON HCL 4 MG/2ML IJ SOLN
INTRAMUSCULAR | Status: AC
  Filled 2013-03-02: qty 2

## 2013-03-02 MED ORDER — OXYCODONE-ACETAMINOPHEN 5-325 MG PO TABS: 1.0000 | ORAL_TABLET | ORAL | Status: DC | PRN

## 2013-03-02 MED ORDER — PROPOFOL INFUSION 10 MG/ML OPTIME
INTRAVENOUS | Status: DC | PRN
  Administered 2013-03-02: 25 ug/kg/min via INTRAVENOUS

## 2013-03-02 MED ORDER — FENTANYL CITRATE 0.05 MG/ML IJ SOLN
INTRAMUSCULAR | Status: DC | PRN
  Administered 2013-03-02: 25 ug via INTRATHECAL

## 2013-03-02 MED ORDER — MENTHOL 3 MG MT LOZG
1.0000 | LOZENGE | OROMUCOSAL | Status: DC | PRN
  Filled 2013-03-02: qty 9

## 2013-03-02 MED ORDER — ZOLPIDEM TARTRATE 5 MG PO TABS: 5.0000 mg | ORAL_TABLET | Freq: Every evening | ORAL | Status: DC | PRN

## 2013-03-02 MED ORDER — NEOSTIGMINE METHYLSULFATE 1 MG/ML IJ SOLN
INTRAMUSCULAR | Status: AC
  Filled 2013-03-02: qty 1

## 2013-03-02 MED ORDER — NALOXONE HCL 0.4 MG/ML IJ SOLN: 0.4000 mg | INTRAMUSCULAR | Status: DC | PRN

## 2013-03-02 MED ORDER — DIPHENHYDRAMINE HCL 12.5 MG/5ML PO ELIX
12.5000 mg | ORAL_SOLUTION | Freq: Four times a day (QID) | ORAL | Status: DC | PRN
  Filled 2013-03-02: qty 5

## 2013-03-02 MED ORDER — SCOPOLAMINE 1 MG/3DAYS TD PT72
MEDICATED_PATCH | TRANSDERMAL | Status: AC
  Administered 2013-03-02: 1.5 mg via TRANSDERMAL
  Filled 2013-03-02: qty 1

## 2013-03-02 MED ORDER — EPHEDRINE 5 MG/ML INJ
INTRAVENOUS | Status: AC
  Filled 2013-03-02: qty 10

## 2013-03-02 MED ORDER — SODIUM CHLORIDE 0.9 % IJ SOLN: 3.0000 mL | INTRAMUSCULAR | Status: DC | PRN

## 2013-03-02 MED ORDER — DIPHENHYDRAMINE HCL 25 MG PO CAPS
25.0000 mg | ORAL_CAPSULE | ORAL | Status: DC | PRN
  Filled 2013-03-02: qty 1

## 2013-03-02 MED ORDER — DIPHENHYDRAMINE HCL 50 MG/ML IJ SOLN
INTRAMUSCULAR | Status: AC
  Administered 2013-03-02: 12.5 mg via INTRAVENOUS
  Filled 2013-03-02: qty 1

## 2013-03-02 MED ORDER — LISINOPRIL-HYDROCHLOROTHIAZIDE 20-12.5 MG PO TABS: 1.0000 | ORAL_TABLET | Freq: Every day | ORAL | Status: DC

## 2013-03-02 MED ORDER — MORPHINE SULFATE 0.5 MG/ML IJ SOLN
INTRAMUSCULAR | Status: AC
  Filled 2013-03-02: qty 10

## 2013-03-02 MED ORDER — ESTRADIOL 0.1 MG/GM VA CREA
TOPICAL_CREAM | VAGINAL | Status: AC
  Filled 2013-03-02: qty 42.5

## 2013-03-02 MED ORDER — KETOROLAC TROMETHAMINE 30 MG/ML IJ SOLN
INTRAMUSCULAR | Status: DC | PRN
  Administered 2013-03-02: 30 mg via INTRAVENOUS

## 2013-03-02 MED ORDER — MORPHINE SULFATE (PF) 0.5 MG/ML IJ SOLN
INTRAMUSCULAR | Status: DC | PRN
  Administered 2013-03-02: .15 mg via INTRATHECAL

## 2013-03-02 MED ORDER — METOCLOPRAMIDE HCL 5 MG/ML IJ SOLN
10.0000 mg | Freq: Once | INTRAMUSCULAR | Status: AC | PRN
  Administered 2013-03-02: 10 mg via INTRAVENOUS

## 2013-03-02 MED ORDER — ONDANSETRON HCL 4 MG/2ML IJ SOLN
INTRAMUSCULAR | Status: DC | PRN
  Administered 2013-03-02: 4 mg via INTRAVENOUS

## 2013-03-02 MED ORDER — HYDROCHLOROTHIAZIDE 12.5 MG PO CAPS
12.5000 mg | ORAL_CAPSULE | Freq: Every day | ORAL | Status: DC
  Administered 2013-03-03: 12.5 mg via ORAL
  Filled 2013-03-02 (×2): qty 1

## 2013-03-02 MED ORDER — GLIMEPIRIDE 2 MG PO TABS
2.0000 mg | ORAL_TABLET | Freq: Every day | ORAL | Status: DC
  Administered 2013-03-03: 2 mg via ORAL
  Filled 2013-03-02 (×3): qty 1

## 2013-03-02 SURGICAL SUPPLY — 25 items
CANISTER SUCT 3000ML (MISCELLANEOUS) ×2 IMPLANT
CLOTH BEACON ORANGE TIMEOUT ST (SAFETY) ×2 IMPLANT
CONT PATH 16OZ SNAP LID 3702 (MISCELLANEOUS) IMPLANT
DECANTER SPIKE VIAL GLASS SM (MISCELLANEOUS) ×1 IMPLANT
GAUZE PACKING 1 X5 YD ST (GAUZE/BANDAGES/DRESSINGS) IMPLANT
GLOVE BIO SURGEON STRL SZ7 (GLOVE) ×4 IMPLANT
GOWN STRL REIN XL XLG (GOWN DISPOSABLE) ×8 IMPLANT
NDL SPNL 22GX3.5 QUINCKE BK (NEEDLE) IMPLANT
NEEDLE HYPO 22GX1.5 SAFETY (NEEDLE) ×1 IMPLANT
NEEDLE SPNL 22GX3.5 QUINCKE BK (NEEDLE) IMPLANT
NS IRRIG 1000ML POUR BTL (IV SOLUTION) ×2 IMPLANT
PACK VAGINAL WOMENS (CUSTOM PROCEDURE TRAY) ×2 IMPLANT
SET CYSTO W/LG BORE CLAMP LF (SET/KITS/TRAYS/PACK) IMPLANT
SUT CHROMIC 2 0 CT 1 (SUTURE) IMPLANT
SUT SILK 2 0 FSL 18 (SUTURE) IMPLANT
SUT VIC AB 2-0 CT1 27 (SUTURE)
SUT VIC AB 2-0 CT1 TAPERPNT 27 (SUTURE) IMPLANT
SUT VIC AB 2-0 SH 27 (SUTURE)
SUT VIC AB 2-0 SH 27XBRD (SUTURE) IMPLANT
SUT VIC AB 2-0 UR6 27 (SUTURE) ×6 IMPLANT
SUT VICRYL RAPIDE 3-0 36IN (SUTURE) ×8 IMPLANT
SYR CONTROL 10ML LL (SYRINGE) ×2 IMPLANT
TOWEL OR 17X24 6PK STRL BLUE (TOWEL DISPOSABLE) ×4 IMPLANT
TRAY FOLEY CATH 14FR (SET/KITS/TRAYS/PACK) ×1 IMPLANT
WATER STERILE IRR 1000ML POUR (IV SOLUTION) ×2 IMPLANT

## 2013-03-02 NOTE — Anesthesia Procedure Notes (Signed)
Spinal  Patient location during procedure: OR Start time: 03/02/2013 7:36 AM Staffing Anesthesiologist: Duron Meister A. Performed by: anesthesiologist  Preanesthetic Checklist Completed: patient identified, site marked, surgical consent, pre-op evaluation, timeout performed, IV checked, risks and benefits discussed and monitors and equipment checked Spinal Block Patient position: sitting Prep: site prepped and draped and DuraPrep Patient monitoring: heart rate, cardiac monitor, continuous pulse ox and blood pressure Approach: midline Location: L4-5 Injection technique: single-shot Needle Needle type: Sprotte  Needle gauge: 24 G Needle length: 9 cm Needle insertion depth: 6 cm Assessment Sensory level: T8 Additional Notes Patient tolerated procedure well. Adequate sensory block.

## 2013-03-02 NOTE — Anesthesia Postprocedure Evaluation (Signed)
  Anesthesia Post-op Note  Patient: [name]  Procedure(s) Performed: Procedure(s): POSTERIOR REPAIR (RECTOCELE) (N/A)  Patient Location: PACU and Women's Unit  Anesthesia Type:spinal  Level of Consciousness: awake, alert  and oriented  Airway and Oxygen Therapy: Patient Spontanous Breathing and Patient connected to nasal cannula oxygen  Post-op Pain: none  Post-op Assessment: Post-op Vital signs reviewed and Patient's Cardiovascular Status Stable  Post-op Vital Signs: Reviewed and stable  Complications: No apparent anesthesia complications

## 2013-03-02 NOTE — Anesthesia Postprocedure Evaluation (Signed)
  Anesthesia Post-op Note  Patient: [name]  Procedure(s) Performed: Procedure(s): POSTERIOR REPAIR (RECTOCELE) (N/A)  Patient Location: PACU  Anesthesia Type:Spinal  Level of Consciousness: awake, alert  and oriented  Airway and Oxygen Therapy: Patient Spontanous Breathing  Post-op Pain: none  Post-op Assessment: Post-op Vital signs reviewed, Patient's Cardiovascular Status Stable, Respiratory Function Stable, Patent Airway, NAUSEA AND VOMITING PRESENT, Pain level controlled, No headache and No backache  Post-op Vital Signs: Reviewed and stable  Complications: No apparent anesthesia complications

## 2013-03-02 NOTE — Op Note (Signed)
Preoperative diagnosis: Symptomatic rectocele  Postoperative diagnosis: Same  Procedure: Rectocele repair  Surgeon: Marcelle Overlie  Anesthesia: Spinal  EBL: 100 cc  Complications: None  Procedure and findings:  Patient taken the operating room after an adequate level of spinal anesthetic was obtained the patient's legs in stirrups the perineum and vagina were prepped and draped in the usual fashion for posterior repair. Bladder drained with a in and out catheter. Appropriate timeout for taken at that point.  Allis clamps were then used to mark the lower margin of the dissection, a small triangle of perineal skin was excised, the posterior vaginal mucosa was dissected then divided in the midline approximately 2/3 the way out. The perirectal fascia and rectum below within and dissected off. Once this was separated, the rectocele was reduced and the pararectal fascia was plicated in the midline with 2-0 Vicryl interrupted sutures. Small amount of excessive vaginal and mucosa was trimmed. Vaginal mucosa was then reapproximated with 2-0 Vicryl interrupted sutures. 3-0 Vicryl repeat used on the perineum to close the deeper tissue and also the skin. Minimal bleeding was noted. She tolerated this well went to recovery room in good condition.  Dictated with [facility]  Geary Rufo M. Milana Obey.D.

## 2013-03-02 NOTE — Progress Notes (Signed)
The patient was re-examined with no change in status 

## 2013-03-02 NOTE — Transfer of Care (Signed)
Immediate Anesthesia Transfer of Care Note  Patient: [name]  Procedure(s) Performed: Procedure(s): POSTERIOR REPAIR (RECTOCELE) (N/A)  Patient Location: PACU  Anesthesia Type:Regional and Spinal  Level of Consciousness: awake, alert , oriented and patient cooperative  Airway & Oxygen Therapy: Patient Spontanous Breathing  Post-op Assessment: Report given to PACU RN and Post -op Vital signs reviewed and stable  Post vital signs: Reviewed and stable  Complications: No apparent anesthesia complications

## 2013-03-03 LAB — CBC
HCT: 30.1 % — ABNORMAL LOW (ref 36.0–46.0)
Hemoglobin: 10 g/dL — ABNORMAL LOW (ref 12.0–15.0)
MCH: 29.5 pg (ref 26.0–34.0)
MCHC: 33.2 g/dL (ref 30.0–36.0)
MCV: 88.8 fL (ref 78.0–100.0)
Platelets: 197 10*3/uL (ref 150–400)
RBC: 3.39 MIL/uL — ABNORMAL LOW (ref 3.87–5.11)

## 2013-03-03 LAB — GLUCOSE, CAPILLARY: Glucose-Capillary: 143 mg/dL — ABNORMAL HIGH (ref 70–99)

## 2013-03-03 MED ORDER — ONDANSETRON 8 MG PO TBDP
8.0000 mg | ORAL_TABLET | Freq: Once | ORAL | Status: AC
  Administered 2013-03-03: 8 mg via ORAL
  Filled 2013-03-03: qty 1

## 2013-03-03 MED ORDER — IBUPROFEN 800 MG PO TABS: 800.0000 mg | ORAL_TABLET | Freq: Three times a day (TID) | ORAL | Status: DC | PRN

## 2013-03-03 MED ORDER — HYDROCODONE-IBUPROFEN 7.5-200 MG PO TABS: 1.0000 | ORAL_TABLET | Freq: Three times a day (TID) | ORAL | Status: DC | PRN

## 2013-03-03 MED ORDER — DOCUSATE SODIUM 100 MG PO CAPS: 100.0000 mg | ORAL_CAPSULE | Freq: Two times a day (BID) | ORAL | Status: DC

## 2013-03-03 NOTE — Progress Notes (Signed)
Patient vomited late this evening. Notified md. Orders recd. Pt discharged after taking po zofran. Md order to call zofran in to patients pharmacy.

## 2013-03-03 NOTE — Discharge Summary (Signed)
Physician Discharge Summary  Patient ID: [name] MRN: [id number] DOB/AGE: Aug 21, 1945 67 y.o.  Admit date: 03/02/2013 Discharge date: 03/03/2013  Admission Diagnoses:  Discharge Diagnoses:  Active Problems:   * No active hospital problems. *   Discharged Condition: good  Hospital Course: adm for post repair, adm overnight, afeb nwext AM, tol PO and ready for D/C  Consults: None  Significant Diagnostic Studies: labs:  Results for orders placed during the hospital encounter of 03/02/13 (from the past 24 hour(s))  GLUCOSE, CAPILLARY     Status: Abnormal   Collection Time    03/02/13  8:55 AM      Result Value Range   Glucose-Capillary 128 (*) 70 - 99 mg/dL  GLUCOSE, CAPILLARY     Status: Abnormal   Collection Time    03/02/13  5:38 PM      Result Value Range   Glucose-Capillary 206 (*) 70 - 99 mg/dL   Comment 1 Notify RN    CBC     Status: Abnormal   Collection Time    03/03/13  5:20 AM      Result Value Range   WBC 9.6  4.0 - 10.5 K/uL   RBC 3.39 (*) 3.87 - 5.11 MIL/uL   Hemoglobin 10.0 (*) 12.0 - 15.0 g/dL   HCT 40.9 (*) 81.1 - 91.4 %   MCV 88.8  78.0 - 100.0 fL   MCH 29.5  26.0 - 34.0 pg   MCHC 33.2  30.0 - 36.0 g/dL   RDW 78.2  95.6 - 21.3 %   Platelets 197  150 - 400 K/uL  GLUCOSE, CAPILLARY     Status: Abnormal   Collection Time    03/03/13  6:20 AM      Result Value Range   Glucose-Capillary 143 (*) 70 - 99 mg/dL    Treatments: surgery: post repair  Discharge Exam: Blood pressure 131/65, pulse 94, temperature 98.8 F (37.1 C), temperature source Oral, resp. rate 18, height 5' (1.524 m), weight 216 lb (97.977 kg), SpO2 100.00%. abd soft + BS  Disposition: Final discharge disposition not confirmed     Medication List    STOP taking these medications       metFORMIN 1000 MG tablet  Commonly known as:  GLUCOPHAGE      TAKE these medications       ALPRAZolam 0.5 MG tablet  Commonly known as:  XANAX  Take 0.5 mg by mouth 2 (two) times  daily.     colestipol 1 G tablet  Commonly known as:  COLESTID  Take 1 g by mouth daily as needed.     docusate sodium 100 MG capsule  Commonly known as:  COLACE  Take 1 capsule (100 mg total) by mouth 2 (two) times daily.     glimepiride 2 MG tablet  Commonly known as:  AMARYL  Take 2 mg by mouth daily before breakfast.     HYDROcodone-ibuprofen 7.5-200 MG per tablet  Commonly known as:  VICOPROFEN  Take 1 tablet by mouth every 8 (eight) hours as needed for moderate pain.     ibuprofen 800 MG tablet  Commonly known as:  ADVIL,MOTRIN  Take 1 tablet (800 mg total) by mouth every 8 (eight) hours as needed (mild pain).     lisinopril-hydrochlorothiazide 20-12.5 MG per tablet  Commonly known as:  PRINZIDE,ZESTORETIC  Take 1 tablet by mouth daily.     LUCENTIS IO  Inject 1 application into the eye.     multivitamin  with minerals Tabs tablet  Take 1 tablet by mouth daily.     PRESERVISION/LUTEIN PO  Take 1 tablet by mouth 2 (two) times daily.     rosuvastatin 20 MG tablet  Commonly known as:  CRESTOR  Take 20 mg by mouth daily.     sertraline 100 MG tablet  Commonly known as:  ZOLOFT  Take 100 mg by mouth daily.     timolol 0.5 % ophthalmic solution  Commonly known as:  TIMOPTIC  Place 1 drop into both eyes daily.           Follow-up Information   Follow up with Meriel Pica, MD. Schedule an appointment as soon as possible for a visit in 2 weeks.   Specialty:  Obstetrics and Gynecology   Contact information:   [address] [place] [address] [phone]       Signed: Meriel Pica 03/03/2013, 8:33 AM

## 2013-03-03 NOTE — Progress Notes (Signed)
Patient had difficulty voiding this am. Bladder scan, >500, i/o cathed, pt tol well.  Pt had 1 episode of vomiting , notified md of all . No new orders. Patient to void prior to d/c.

## 2013-03-04 ENCOUNTER — Encounter (HOSPITAL_COMMUNITY): Payer: Self-pay | Admitting: Obstetrics and Gynecology

## 2013-03-13 DIAGNOSIS — K529 Noninfective gastroenteritis and colitis, unspecified: Secondary | ICD-10-CM | POA: Diagnosis not present

## 2013-03-13 DIAGNOSIS — R110 Nausea: Secondary | ICD-10-CM | POA: Diagnosis not present

## 2013-04-11 DIAGNOSIS — H353290 Exudative age-related macular degeneration, unspecified eye, stage unspecified: Secondary | ICD-10-CM | POA: Diagnosis not present

## 2013-05-02 DIAGNOSIS — H3530 Unspecified macular degeneration: Secondary | ICD-10-CM | POA: Diagnosis not present

## 2013-05-02 DIAGNOSIS — E119 Type 2 diabetes mellitus without complications: Secondary | ICD-10-CM | POA: Diagnosis not present

## 2013-05-02 DIAGNOSIS — H35059 Retinal neovascularization, unspecified, unspecified eye: Secondary | ICD-10-CM | POA: Diagnosis not present

## 2013-05-02 DIAGNOSIS — H35359 Cystoid macular degeneration, unspecified eye: Secondary | ICD-10-CM | POA: Diagnosis not present

## 2013-05-02 DIAGNOSIS — H4011X0 Primary open-angle glaucoma, stage unspecified: Secondary | ICD-10-CM | POA: Diagnosis not present

## 2013-07-25 DIAGNOSIS — H35359 Cystoid macular degeneration, unspecified eye: Secondary | ICD-10-CM | POA: Diagnosis not present

## 2013-07-25 DIAGNOSIS — H353290 Exudative age-related macular degeneration, unspecified eye, stage unspecified: Secondary | ICD-10-CM | POA: Diagnosis not present

## 2013-08-25 DIAGNOSIS — F329 Major depressive disorder, single episode, unspecified: Secondary | ICD-10-CM | POA: Diagnosis not present

## 2013-08-25 DIAGNOSIS — I10 Essential (primary) hypertension: Secondary | ICD-10-CM | POA: Diagnosis not present

## 2013-08-25 DIAGNOSIS — M159 Polyosteoarthritis, unspecified: Secondary | ICD-10-CM | POA: Diagnosis not present

## 2013-08-25 DIAGNOSIS — E119 Type 2 diabetes mellitus without complications: Secondary | ICD-10-CM | POA: Diagnosis not present

## 2013-08-25 DIAGNOSIS — E7800 Pure hypercholesterolemia, unspecified: Secondary | ICD-10-CM | POA: Diagnosis not present

## 2013-09-18 DIAGNOSIS — J069 Acute upper respiratory infection, unspecified: Secondary | ICD-10-CM | POA: Diagnosis not present

## 2013-10-23 DIAGNOSIS — K529 Noninfective gastroenteritis and colitis, unspecified: Secondary | ICD-10-CM | POA: Diagnosis not present

## 2013-10-23 DIAGNOSIS — R159 Full incontinence of feces: Secondary | ICD-10-CM | POA: Diagnosis not present

## 2013-10-24 DIAGNOSIS — H353290 Exudative age-related macular degeneration, unspecified eye, stage unspecified: Secondary | ICD-10-CM | POA: Diagnosis not present

## 2013-11-27 DIAGNOSIS — H3530 Unspecified macular degeneration: Secondary | ICD-10-CM | POA: Diagnosis not present

## 2013-11-27 DIAGNOSIS — H5210 Myopia, unspecified eye: Secondary | ICD-10-CM | POA: Diagnosis not present

## 2013-11-27 DIAGNOSIS — H4010X0 Unspecified open-angle glaucoma, stage unspecified: Secondary | ICD-10-CM | POA: Diagnosis not present

## 2013-11-27 DIAGNOSIS — H409 Unspecified glaucoma: Secondary | ICD-10-CM | POA: Diagnosis not present

## 2013-12-08 DIAGNOSIS — H353290 Exudative age-related macular degeneration, unspecified eye, stage unspecified: Secondary | ICD-10-CM | POA: Diagnosis not present

## 2013-12-08 DIAGNOSIS — H35359 Cystoid macular degeneration, unspecified eye: Secondary | ICD-10-CM | POA: Diagnosis not present

## 2014-01-03 ENCOUNTER — Other Ambulatory Visit: Payer: Self-pay | Admitting: Gastroenterology

## 2014-01-03 DIAGNOSIS — K529 Noninfective gastroenteritis and colitis, unspecified: Secondary | ICD-10-CM | POA: Diagnosis not present

## 2014-01-03 DIAGNOSIS — R110 Nausea: Secondary | ICD-10-CM | POA: Diagnosis not present

## 2014-01-03 DIAGNOSIS — K219 Gastro-esophageal reflux disease without esophagitis: Secondary | ICD-10-CM | POA: Diagnosis not present

## 2014-01-03 DIAGNOSIS — R1112 Projectile vomiting: Secondary | ICD-10-CM

## 2014-01-12 ENCOUNTER — Ambulatory Visit
Admission: RE | Admit: 2014-01-12 | Discharge: 2014-01-12 | Disposition: A | Discharge status: Home / Self Care | Payer: Medicare Other | Source: Ambulatory Visit | Attending: Gastroenterology | Admitting: Gastroenterology

## 2014-01-12 ENCOUNTER — Other Ambulatory Visit: Payer: Self-pay | Admitting: Gastroenterology

## 2014-01-12 DIAGNOSIS — R1112 Projectile vomiting: Secondary | ICD-10-CM

## 2014-01-12 DIAGNOSIS — R935 Abnormal findings on diagnostic imaging of other abdominal regions, including retroperitoneum: Secondary | ICD-10-CM

## 2014-01-12 DIAGNOSIS — R112 Nausea with vomiting, unspecified: Secondary | ICD-10-CM | POA: Diagnosis not present

## 2014-01-16 DIAGNOSIS — R9389 Abnormal findings on diagnostic imaging of other specified body structures: Secondary | ICD-10-CM | POA: Diagnosis not present

## 2014-01-20 ENCOUNTER — Ambulatory Visit
Admission: RE | Admit: 2014-01-20 | Discharge: 2014-01-20 | Disposition: A | Discharge status: Home / Self Care | Payer: Medicare Other | Source: Ambulatory Visit | Attending: Gastroenterology | Admitting: Gastroenterology

## 2014-01-20 DIAGNOSIS — K7689 Other specified diseases of liver: Secondary | ICD-10-CM | POA: Diagnosis not present

## 2014-01-20 DIAGNOSIS — R935 Abnormal findings on diagnostic imaging of other abdominal regions, including retroperitoneum: Secondary | ICD-10-CM

## 2014-01-20 DIAGNOSIS — K760 Fatty (change of) liver, not elsewhere classified: Secondary | ICD-10-CM | POA: Diagnosis not present

## 2014-01-20 MED ORDER — GADOXETATE DISODIUM 0.25 MMOL/ML IV SOLN
10.0000 mL | Freq: Once | INTRAVENOUS | Status: AC | PRN
  Administered 2014-01-20: 10 mL via INTRAVENOUS

## 2014-01-31 DIAGNOSIS — R159 Full incontinence of feces: Secondary | ICD-10-CM | POA: Diagnosis not present

## 2014-01-31 DIAGNOSIS — Z01419 Encounter for gynecological examination (general) (routine) without abnormal findings: Secondary | ICD-10-CM | POA: Diagnosis not present

## 2014-01-31 DIAGNOSIS — Z1231 Encounter for screening mammogram for malignant neoplasm of breast: Secondary | ICD-10-CM | POA: Diagnosis not present

## 2014-02-12 ENCOUNTER — Ambulatory Visit (HOSPITAL_COMMUNITY)
Admission: RE | Admit: 2014-02-12 | Discharge: 2014-02-12 | Disposition: A | Discharge status: Home / Self Care | Payer: Medicare Other | Source: Ambulatory Visit | Attending: General Surgery | Admitting: General Surgery

## 2014-02-12 ENCOUNTER — Encounter (HOSPITAL_COMMUNITY)
Admission: RE | Disposition: A | Discharge status: Home / Self Care | Payer: Self-pay | Source: Ambulatory Visit | Attending: General Surgery

## 2014-02-12 DIAGNOSIS — R152 Fecal urgency: Secondary | ICD-10-CM | POA: Diagnosis not present

## 2014-02-12 HISTORY — PX: ANAL RECTAL MANOMETRY: SHX6358

## 2014-02-12 SURGERY — MANOMETRY, ANORECTAL

## 2014-02-13 ENCOUNTER — Encounter (HOSPITAL_COMMUNITY): Payer: Self-pay | Admitting: General Surgery

## 2014-02-13 DIAGNOSIS — H35053 Retinal neovascularization, unspecified, bilateral: Secondary | ICD-10-CM | POA: Diagnosis not present

## 2014-02-13 DIAGNOSIS — E119 Type 2 diabetes mellitus without complications: Secondary | ICD-10-CM | POA: Diagnosis not present

## 2014-02-13 DIAGNOSIS — H35353 Cystoid macular degeneration, bilateral: Secondary | ICD-10-CM | POA: Diagnosis not present

## 2014-02-13 DIAGNOSIS — H3532 Exudative age-related macular degeneration: Secondary | ICD-10-CM | POA: Diagnosis not present

## 2014-03-12 DIAGNOSIS — R159 Full incontinence of feces: Secondary | ICD-10-CM | POA: Diagnosis not present

## 2014-03-13 DIAGNOSIS — R159 Full incontinence of feces: Secondary | ICD-10-CM | POA: Diagnosis not present

## 2014-03-26 DIAGNOSIS — E780 Pure hypercholesterolemia: Secondary | ICD-10-CM | POA: Diagnosis not present

## 2014-03-26 DIAGNOSIS — I10 Essential (primary) hypertension: Secondary | ICD-10-CM | POA: Diagnosis not present

## 2014-03-26 DIAGNOSIS — F329 Major depressive disorder, single episode, unspecified: Secondary | ICD-10-CM | POA: Diagnosis not present

## 2014-03-26 DIAGNOSIS — M150 Primary generalized (osteo)arthritis: Secondary | ICD-10-CM | POA: Diagnosis not present

## 2014-03-26 DIAGNOSIS — Z6841 Body Mass Index (BMI) 40.0 and over, adult: Secondary | ICD-10-CM | POA: Diagnosis not present

## 2014-03-26 DIAGNOSIS — Z23 Encounter for immunization: Secondary | ICD-10-CM | POA: Diagnosis not present

## 2014-03-26 DIAGNOSIS — E119 Type 2 diabetes mellitus without complications: Secondary | ICD-10-CM | POA: Diagnosis not present

## 2014-04-17 DIAGNOSIS — H35353 Cystoid macular degeneration, bilateral: Secondary | ICD-10-CM | POA: Diagnosis not present

## 2014-04-17 DIAGNOSIS — H35053 Retinal neovascularization, unspecified, bilateral: Secondary | ICD-10-CM | POA: Diagnosis not present

## 2014-04-17 DIAGNOSIS — H3532 Exudative age-related macular degeneration: Secondary | ICD-10-CM | POA: Diagnosis not present

## 2014-04-17 DIAGNOSIS — E119 Type 2 diabetes mellitus without complications: Secondary | ICD-10-CM | POA: Diagnosis not present

## 2014-06-28 DIAGNOSIS — Z961 Presence of intraocular lens: Secondary | ICD-10-CM | POA: Diagnosis not present

## 2014-06-28 DIAGNOSIS — H31011 Macula scars of posterior pole (postinflammatory) (post-traumatic), right eye: Secondary | ICD-10-CM | POA: Diagnosis not present

## 2014-06-28 DIAGNOSIS — E119 Type 2 diabetes mellitus without complications: Secondary | ICD-10-CM | POA: Diagnosis not present

## 2014-06-28 DIAGNOSIS — H3532 Exudative age-related macular degeneration: Secondary | ICD-10-CM | POA: Diagnosis not present

## 2014-07-30 DIAGNOSIS — H4010X4 Unspecified open-angle glaucoma, indeterminate stage: Secondary | ICD-10-CM | POA: Diagnosis not present

## 2014-09-06 DIAGNOSIS — E119 Type 2 diabetes mellitus without complications: Secondary | ICD-10-CM | POA: Diagnosis not present

## 2014-09-06 DIAGNOSIS — Z961 Presence of intraocular lens: Secondary | ICD-10-CM | POA: Diagnosis not present

## 2014-09-06 DIAGNOSIS — H31011 Macula scars of posterior pole (postinflammatory) (post-traumatic), right eye: Secondary | ICD-10-CM | POA: Diagnosis not present

## 2014-09-06 DIAGNOSIS — H3532 Exudative age-related macular degeneration: Secondary | ICD-10-CM | POA: Diagnosis not present

## 2014-09-20 DIAGNOSIS — L919 Hypertrophic disorder of the skin, unspecified: Secondary | ICD-10-CM | POA: Diagnosis not present

## 2014-09-20 DIAGNOSIS — L918 Other hypertrophic disorders of the skin: Secondary | ICD-10-CM | POA: Diagnosis not present

## 2014-09-20 DIAGNOSIS — L723 Sebaceous cyst: Secondary | ICD-10-CM | POA: Diagnosis not present

## 2014-09-20 DIAGNOSIS — L738 Other specified follicular disorders: Secondary | ICD-10-CM | POA: Diagnosis not present

## 2014-09-25 DIAGNOSIS — E119 Type 2 diabetes mellitus without complications: Secondary | ICD-10-CM | POA: Diagnosis not present

## 2014-09-25 DIAGNOSIS — I10 Essential (primary) hypertension: Secondary | ICD-10-CM | POA: Diagnosis not present

## 2014-09-25 DIAGNOSIS — Z6841 Body Mass Index (BMI) 40.0 and over, adult: Secondary | ICD-10-CM | POA: Diagnosis not present

## 2014-09-25 DIAGNOSIS — E780 Pure hypercholesterolemia: Secondary | ICD-10-CM | POA: Diagnosis not present

## 2014-09-25 DIAGNOSIS — F329 Major depressive disorder, single episode, unspecified: Secondary | ICD-10-CM | POA: Diagnosis not present

## 2014-10-15 ENCOUNTER — Other Ambulatory Visit: Payer: Self-pay

## 2014-12-13 DIAGNOSIS — H31011 Macula scars of posterior pole (postinflammatory) (post-traumatic), right eye: Secondary | ICD-10-CM | POA: Diagnosis not present

## 2014-12-13 DIAGNOSIS — H3532 Exudative age-related macular degeneration: Secondary | ICD-10-CM | POA: Diagnosis not present

## 2014-12-13 DIAGNOSIS — E119 Type 2 diabetes mellitus without complications: Secondary | ICD-10-CM | POA: Diagnosis not present

## 2014-12-13 DIAGNOSIS — Z961 Presence of intraocular lens: Secondary | ICD-10-CM | POA: Diagnosis not present

## 2015-01-22 DIAGNOSIS — E119 Type 2 diabetes mellitus without complications: Secondary | ICD-10-CM | POA: Diagnosis not present

## 2015-01-22 DIAGNOSIS — H353230 Exudative age-related macular degeneration, bilateral, stage unspecified: Secondary | ICD-10-CM | POA: Diagnosis not present

## 2015-01-22 DIAGNOSIS — Z961 Presence of intraocular lens: Secondary | ICD-10-CM | POA: Diagnosis not present

## 2015-02-19 DIAGNOSIS — M67431 Ganglion, right wrist: Secondary | ICD-10-CM | POA: Diagnosis not present

## 2015-02-19 DIAGNOSIS — R110 Nausea: Secondary | ICD-10-CM | POA: Diagnosis not present

## 2015-02-19 DIAGNOSIS — Z23 Encounter for immunization: Secondary | ICD-10-CM | POA: Diagnosis not present

## 2015-02-19 DIAGNOSIS — M65311 Trigger thumb, right thumb: Secondary | ICD-10-CM | POA: Diagnosis not present

## 2015-03-04 DIAGNOSIS — M65311 Trigger thumb, right thumb: Secondary | ICD-10-CM | POA: Diagnosis not present

## 2015-03-27 DIAGNOSIS — I10 Essential (primary) hypertension: Secondary | ICD-10-CM | POA: Diagnosis not present

## 2015-03-27 DIAGNOSIS — Z6841 Body Mass Index (BMI) 40.0 and over, adult: Secondary | ICD-10-CM | POA: Diagnosis not present

## 2015-03-27 DIAGNOSIS — E119 Type 2 diabetes mellitus without complications: Secondary | ICD-10-CM | POA: Diagnosis not present

## 2015-03-27 DIAGNOSIS — Z7984 Long term (current) use of oral hypoglycemic drugs: Secondary | ICD-10-CM | POA: Diagnosis not present

## 2015-03-27 DIAGNOSIS — E7800 Pure hypercholesterolemia, unspecified: Secondary | ICD-10-CM | POA: Diagnosis not present

## 2015-03-27 DIAGNOSIS — N951 Menopausal and female climacteric states: Secondary | ICD-10-CM | POA: Diagnosis not present

## 2015-03-27 DIAGNOSIS — F339 Major depressive disorder, recurrent, unspecified: Secondary | ICD-10-CM | POA: Diagnosis not present

## 2015-04-16 DIAGNOSIS — M65311 Trigger thumb, right thumb: Secondary | ICD-10-CM | POA: Diagnosis not present

## 2015-04-16 DIAGNOSIS — G5601 Carpal tunnel syndrome, right upper limb: Secondary | ICD-10-CM | POA: Diagnosis not present

## 2015-05-08 DIAGNOSIS — Z780 Asymptomatic menopausal state: Secondary | ICD-10-CM | POA: Diagnosis not present

## 2015-05-08 DIAGNOSIS — M8589 Other specified disorders of bone density and structure, multiple sites: Secondary | ICD-10-CM | POA: Diagnosis not present

## 2015-05-14 DIAGNOSIS — G5601 Carpal tunnel syndrome, right upper limb: Secondary | ICD-10-CM | POA: Diagnosis not present

## 2015-05-14 DIAGNOSIS — M65311 Trigger thumb, right thumb: Secondary | ICD-10-CM | POA: Diagnosis not present

## 2015-06-06 DIAGNOSIS — M65311 Trigger thumb, right thumb: Secondary | ICD-10-CM | POA: Diagnosis not present

## 2015-06-06 DIAGNOSIS — G5601 Carpal tunnel syndrome, right upper limb: Secondary | ICD-10-CM | POA: Diagnosis not present

## 2015-06-21 DIAGNOSIS — M65311 Trigger thumb, right thumb: Secondary | ICD-10-CM | POA: Diagnosis not present

## 2015-06-21 DIAGNOSIS — G5601 Carpal tunnel syndrome, right upper limb: Secondary | ICD-10-CM | POA: Diagnosis not present

## 2015-07-23 DIAGNOSIS — E119 Type 2 diabetes mellitus without complications: Secondary | ICD-10-CM | POA: Diagnosis not present

## 2015-07-23 DIAGNOSIS — H353232 Exudative age-related macular degeneration, bilateral, with inactive choroidal neovascularization: Secondary | ICD-10-CM | POA: Diagnosis not present

## 2015-07-23 DIAGNOSIS — H31011 Macula scars of posterior pole (postinflammatory) (post-traumatic), right eye: Secondary | ICD-10-CM | POA: Diagnosis not present

## 2015-07-23 DIAGNOSIS — Z961 Presence of intraocular lens: Secondary | ICD-10-CM | POA: Diagnosis not present

## 2015-07-23 DIAGNOSIS — H26492 Other secondary cataract, left eye: Secondary | ICD-10-CM | POA: Diagnosis not present

## 2015-09-13 DIAGNOSIS — H2640 Unspecified secondary cataract: Secondary | ICD-10-CM | POA: Diagnosis not present

## 2015-09-27 DIAGNOSIS — N951 Menopausal and female climacteric states: Secondary | ICD-10-CM | POA: Diagnosis not present

## 2015-09-27 DIAGNOSIS — E119 Type 2 diabetes mellitus without complications: Secondary | ICD-10-CM | POA: Diagnosis not present

## 2015-09-27 DIAGNOSIS — E7800 Pure hypercholesterolemia, unspecified: Secondary | ICD-10-CM | POA: Diagnosis not present

## 2015-09-27 DIAGNOSIS — Z7984 Long term (current) use of oral hypoglycemic drugs: Secondary | ICD-10-CM | POA: Diagnosis not present

## 2015-09-27 DIAGNOSIS — F320 Major depressive disorder, single episode, mild: Secondary | ICD-10-CM | POA: Diagnosis not present

## 2015-09-27 DIAGNOSIS — I10 Essential (primary) hypertension: Secondary | ICD-10-CM | POA: Diagnosis not present

## 2015-10-25 DIAGNOSIS — H2640 Unspecified secondary cataract: Secondary | ICD-10-CM | POA: Diagnosis not present

## 2015-12-04 DIAGNOSIS — H524 Presbyopia: Secondary | ICD-10-CM | POA: Diagnosis not present

## 2015-12-04 DIAGNOSIS — H401134 Primary open-angle glaucoma, bilateral, indeterminate stage: Secondary | ICD-10-CM | POA: Diagnosis not present

## 2015-12-04 DIAGNOSIS — H52203 Unspecified astigmatism, bilateral: Secondary | ICD-10-CM | POA: Diagnosis not present

## 2016-01-07 DIAGNOSIS — G5601 Carpal tunnel syndrome, right upper limb: Secondary | ICD-10-CM | POA: Diagnosis not present

## 2016-01-07 DIAGNOSIS — M65311 Trigger thumb, right thumb: Secondary | ICD-10-CM | POA: Diagnosis not present

## 2016-01-27 DIAGNOSIS — M25512 Pain in left shoulder: Secondary | ICD-10-CM | POA: Diagnosis not present

## 2016-01-27 DIAGNOSIS — Z23 Encounter for immunization: Secondary | ICD-10-CM | POA: Diagnosis not present

## 2016-01-28 DIAGNOSIS — H31011 Macula scars of posterior pole (postinflammatory) (post-traumatic), right eye: Secondary | ICD-10-CM | POA: Diagnosis not present

## 2016-01-28 DIAGNOSIS — Z961 Presence of intraocular lens: Secondary | ICD-10-CM | POA: Diagnosis not present

## 2016-01-28 DIAGNOSIS — H353232 Exudative age-related macular degeneration, bilateral, with inactive choroidal neovascularization: Secondary | ICD-10-CM | POA: Diagnosis not present

## 2016-01-28 DIAGNOSIS — E119 Type 2 diabetes mellitus without complications: Secondary | ICD-10-CM | POA: Diagnosis not present

## 2016-03-30 DIAGNOSIS — E1165 Type 2 diabetes mellitus with hyperglycemia: Secondary | ICD-10-CM | POA: Diagnosis not present

## 2016-03-30 DIAGNOSIS — Z7984 Long term (current) use of oral hypoglycemic drugs: Secondary | ICD-10-CM | POA: Diagnosis not present

## 2016-03-30 DIAGNOSIS — M25512 Pain in left shoulder: Secondary | ICD-10-CM | POA: Diagnosis not present

## 2016-03-30 DIAGNOSIS — E7800 Pure hypercholesterolemia, unspecified: Secondary | ICD-10-CM | POA: Diagnosis not present

## 2016-03-30 DIAGNOSIS — Z6841 Body Mass Index (BMI) 40.0 and over, adult: Secondary | ICD-10-CM | POA: Diagnosis not present

## 2016-03-30 DIAGNOSIS — L723 Sebaceous cyst: Secondary | ICD-10-CM | POA: Diagnosis not present

## 2016-03-30 DIAGNOSIS — E119 Type 2 diabetes mellitus without complications: Secondary | ICD-10-CM | POA: Diagnosis not present

## 2016-03-30 DIAGNOSIS — I10 Essential (primary) hypertension: Secondary | ICD-10-CM | POA: Diagnosis not present

## 2016-03-30 DIAGNOSIS — F320 Major depressive disorder, single episode, mild: Secondary | ICD-10-CM | POA: Diagnosis not present

## 2016-04-15 DIAGNOSIS — Z1231 Encounter for screening mammogram for malignant neoplasm of breast: Secondary | ICD-10-CM | POA: Diagnosis not present

## 2016-04-15 DIAGNOSIS — Z6841 Body Mass Index (BMI) 40.0 and over, adult: Secondary | ICD-10-CM | POA: Diagnosis not present

## 2016-04-15 DIAGNOSIS — Z01419 Encounter for gynecological examination (general) (routine) without abnormal findings: Secondary | ICD-10-CM | POA: Diagnosis not present

## 2016-06-17 DIAGNOSIS — R609 Edema, unspecified: Secondary | ICD-10-CM | POA: Diagnosis not present

## 2016-06-24 DIAGNOSIS — M25812 Other specified joint disorders, left shoulder: Secondary | ICD-10-CM | POA: Diagnosis not present

## 2016-07-27 DIAGNOSIS — M67912 Unspecified disorder of synovium and tendon, left shoulder: Secondary | ICD-10-CM | POA: Diagnosis not present

## 2016-07-27 DIAGNOSIS — M24812 Other specific joint derangements of left shoulder, not elsewhere classified: Secondary | ICD-10-CM | POA: Diagnosis not present

## 2016-08-21 DIAGNOSIS — L718 Other rosacea: Secondary | ICD-10-CM | POA: Diagnosis not present

## 2016-08-21 DIAGNOSIS — L720 Epidermal cyst: Secondary | ICD-10-CM | POA: Diagnosis not present

## 2016-09-15 DIAGNOSIS — L720 Epidermal cyst: Secondary | ICD-10-CM | POA: Diagnosis not present

## 2016-09-15 HISTORY — PX: CYST EXCISION: SHX5701

## 2016-09-16 ENCOUNTER — Inpatient Hospital Stay (HOSPITAL_COMMUNITY)
Admission: EM | Admit: 2016-09-16 | Discharge: 2016-09-18 | DRG: 872 | Disposition: A | Discharge status: Home / Self Care | Payer: Medicare Other | Attending: Internal Medicine | Admitting: Internal Medicine

## 2016-09-16 ENCOUNTER — Encounter (HOSPITAL_COMMUNITY): Payer: Self-pay | Admitting: Emergency Medicine

## 2016-09-16 ENCOUNTER — Emergency Department (HOSPITAL_COMMUNITY): Payer: Medicare Other

## 2016-09-16 DIAGNOSIS — Z880 Allergy status to penicillin: Secondary | ICD-10-CM

## 2016-09-16 DIAGNOSIS — N390 Urinary tract infection, site not specified: Secondary | ICD-10-CM | POA: Diagnosis present

## 2016-09-16 DIAGNOSIS — R000 Tachycardia, unspecified: Secondary | ICD-10-CM | POA: Diagnosis present

## 2016-09-16 DIAGNOSIS — E1165 Type 2 diabetes mellitus with hyperglycemia: Secondary | ICD-10-CM | POA: Diagnosis not present

## 2016-09-16 DIAGNOSIS — Z87891 Personal history of nicotine dependence: Secondary | ICD-10-CM

## 2016-09-16 DIAGNOSIS — E785 Hyperlipidemia, unspecified: Secondary | ICD-10-CM | POA: Diagnosis present

## 2016-09-16 DIAGNOSIS — H409 Unspecified glaucoma: Secondary | ICD-10-CM | POA: Diagnosis present

## 2016-09-16 DIAGNOSIS — Z886 Allergy status to analgesic agent status: Secondary | ICD-10-CM

## 2016-09-16 DIAGNOSIS — H3530 Unspecified macular degeneration: Secondary | ICD-10-CM | POA: Diagnosis present

## 2016-09-16 DIAGNOSIS — E872 Acidosis: Secondary | ICD-10-CM | POA: Diagnosis not present

## 2016-09-16 DIAGNOSIS — Z882 Allergy status to sulfonamides status: Secondary | ICD-10-CM

## 2016-09-16 DIAGNOSIS — E876 Hypokalemia: Secondary | ICD-10-CM | POA: Diagnosis present

## 2016-09-16 DIAGNOSIS — I10 Essential (primary) hypertension: Secondary | ICD-10-CM | POA: Diagnosis present

## 2016-09-16 DIAGNOSIS — Z885 Allergy status to narcotic agent status: Secondary | ICD-10-CM

## 2016-09-16 DIAGNOSIS — Z79899 Other long term (current) drug therapy: Secondary | ICD-10-CM

## 2016-09-16 DIAGNOSIS — R0600 Dyspnea, unspecified: Secondary | ICD-10-CM | POA: Diagnosis not present

## 2016-09-16 DIAGNOSIS — A419 Sepsis, unspecified organism: Secondary | ICD-10-CM | POA: Diagnosis not present

## 2016-09-16 DIAGNOSIS — F329 Major depressive disorder, single episode, unspecified: Secondary | ICD-10-CM | POA: Diagnosis present

## 2016-09-16 DIAGNOSIS — Z7984 Long term (current) use of oral hypoglycemic drugs: Secondary | ICD-10-CM

## 2016-09-16 LAB — CBC WITH DIFFERENTIAL/PLATELET
BASOS ABS: 0 10*3/uL (ref 0.0–0.1)
Basophils Relative: 0 %
Eosinophils Absolute: 0 10*3/uL (ref 0.0–0.7)
Eosinophils Relative: 0 %
HEMATOCRIT: 38.1 % (ref 36.0–46.0)
HEMOGLOBIN: 12.5 g/dL (ref 12.0–15.0)
LYMPHS PCT: 9 %
Lymphs Abs: 0.5 10*3/uL — ABNORMAL LOW (ref 0.7–4.0)
MCH: 29.8 pg (ref 26.0–34.0)
MCHC: 32.8 g/dL (ref 30.0–36.0)
MCV: 90.9 fL (ref 78.0–100.0)
Monocytes Absolute: 0.2 10*3/uL (ref 0.1–1.0)
Monocytes Relative: 4 %
NEUTROS ABS: 5.1 10*3/uL (ref 1.7–7.7)
Neutrophils Relative %: 87 %
Platelets: 212 10*3/uL (ref 150–400)
RBC: 4.19 MIL/uL (ref 3.87–5.11)
RDW: 14.3 % (ref 11.5–15.5)
WBC: 5.8 10*3/uL (ref 4.0–10.5)

## 2016-09-16 LAB — PROTIME-INR
INR: 1.08
Prothrombin Time: 14 seconds (ref 11.4–15.2)

## 2016-09-16 LAB — I-STAT CG4 LACTIC ACID, ED: Lactic Acid, Venous: 4.59 mmol/L (ref 0.5–1.9)

## 2016-09-16 NOTE — ED Triage Notes (Signed)
Pt in reporting "shaking" and high blood sugar today. Pt had recent procedure yesterday to remove 2 cysts from under R eye. Pt A/OX4.

## 2016-09-17 ENCOUNTER — Other Ambulatory Visit: Payer: Self-pay

## 2016-09-17 ENCOUNTER — Encounter (HOSPITAL_COMMUNITY): Payer: Self-pay | Admitting: Internal Medicine

## 2016-09-17 DIAGNOSIS — Z880 Allergy status to penicillin: Secondary | ICD-10-CM | POA: Diagnosis not present

## 2016-09-17 DIAGNOSIS — N390 Urinary tract infection, site not specified: Secondary | ICD-10-CM | POA: Diagnosis not present

## 2016-09-17 DIAGNOSIS — A419 Sepsis, unspecified organism: Secondary | ICD-10-CM | POA: Diagnosis present

## 2016-09-17 DIAGNOSIS — E876 Hypokalemia: Secondary | ICD-10-CM | POA: Diagnosis present

## 2016-09-17 DIAGNOSIS — E1165 Type 2 diabetes mellitus with hyperglycemia: Secondary | ICD-10-CM | POA: Diagnosis not present

## 2016-09-17 DIAGNOSIS — H3530 Unspecified macular degeneration: Secondary | ICD-10-CM | POA: Diagnosis present

## 2016-09-17 DIAGNOSIS — Z79899 Other long term (current) drug therapy: Secondary | ICD-10-CM | POA: Diagnosis not present

## 2016-09-17 DIAGNOSIS — Z885 Allergy status to narcotic agent status: Secondary | ICD-10-CM | POA: Diagnosis not present

## 2016-09-17 DIAGNOSIS — R000 Tachycardia, unspecified: Secondary | ICD-10-CM | POA: Diagnosis present

## 2016-09-17 DIAGNOSIS — Z7984 Long term (current) use of oral hypoglycemic drugs: Secondary | ICD-10-CM | POA: Diagnosis not present

## 2016-09-17 DIAGNOSIS — E785 Hyperlipidemia, unspecified: Secondary | ICD-10-CM | POA: Diagnosis present

## 2016-09-17 DIAGNOSIS — Z87891 Personal history of nicotine dependence: Secondary | ICD-10-CM | POA: Diagnosis not present

## 2016-09-17 DIAGNOSIS — Z882 Allergy status to sulfonamides status: Secondary | ICD-10-CM | POA: Diagnosis not present

## 2016-09-17 DIAGNOSIS — E872 Acidosis: Secondary | ICD-10-CM | POA: Diagnosis present

## 2016-09-17 DIAGNOSIS — H409 Unspecified glaucoma: Secondary | ICD-10-CM | POA: Diagnosis present

## 2016-09-17 DIAGNOSIS — F329 Major depressive disorder, single episode, unspecified: Secondary | ICD-10-CM | POA: Diagnosis present

## 2016-09-17 DIAGNOSIS — I10 Essential (primary) hypertension: Secondary | ICD-10-CM | POA: Diagnosis not present

## 2016-09-17 DIAGNOSIS — Z886 Allergy status to analgesic agent status: Secondary | ICD-10-CM | POA: Diagnosis not present

## 2016-09-17 LAB — CBC WITH DIFFERENTIAL/PLATELET
BASOS PCT: 0 %
Basophils Absolute: 0 10*3/uL (ref 0.0–0.1)
Eosinophils Absolute: 0 10*3/uL (ref 0.0–0.7)
Eosinophils Relative: 0 %
HEMATOCRIT: 33.1 % — AB (ref 36.0–46.0)
HEMOGLOBIN: 10.8 g/dL — AB (ref 12.0–15.0)
Lymphocytes Relative: 15 %
Lymphs Abs: 0.7 10*3/uL (ref 0.7–4.0)
MCH: 29.4 pg (ref 26.0–34.0)
MCHC: 32.6 g/dL (ref 30.0–36.0)
MCV: 90.2 fL (ref 78.0–100.0)
MONO ABS: 0.3 10*3/uL (ref 0.1–1.0)
Monocytes Relative: 6 %
NEUTROS ABS: 3.8 10*3/uL (ref 1.7–7.7)
NEUTROS PCT: 79 %
Platelets: 170 10*3/uL (ref 150–400)
RBC: 3.67 MIL/uL — AB (ref 3.87–5.11)
RDW: 14.1 % (ref 11.5–15.5)
WBC: 4.9 10*3/uL (ref 4.0–10.5)

## 2016-09-17 LAB — COMPREHENSIVE METABOLIC PANEL
ALBUMIN: 2.7 g/dL — AB (ref 3.5–5.0)
ALT: 17 U/L (ref 14–54)
ALT: 13 U/L — AB (ref 14–54)
AST: 36 U/L (ref 15–41)
AST: 24 U/L (ref 15–41)
Albumin: 3.3 g/dL — ABNORMAL LOW (ref 3.5–5.0)
Alkaline Phosphatase: 47 U/L (ref 38–126)
Alkaline Phosphatase: 39 U/L (ref 38–126)
Anion gap: 12 (ref 5–15)
Anion gap: 7 (ref 5–15)
BILIRUBIN TOTAL: 0.3 mg/dL (ref 0.3–1.2)
BUN: 18 mg/dL (ref 6–20)
BUN: 15 mg/dL (ref 6–20)
CHLORIDE: 99 mmol/L — AB (ref 101–111)
CO2: 20 mmol/L — ABNORMAL LOW (ref 22–32)
CO2: 24 mmol/L (ref 22–32)
CREATININE: 0.85 mg/dL (ref 0.44–1.00)
Calcium: 8.2 mg/dL — ABNORMAL LOW (ref 8.9–10.3)
Calcium: 7.5 mg/dL — ABNORMAL LOW (ref 8.9–10.3)
Chloride: 104 mmol/L (ref 101–111)
Creatinine, Ser: 1.09 mg/dL — ABNORMAL HIGH (ref 0.44–1.00)
GFR calc Af Amer: 58 mL/min — ABNORMAL LOW (ref >60)
GFR calc Af Amer: >60 mL/min (ref >60)
GFR calc non Af Amer: 50 mL/min — ABNORMAL LOW (ref >60)
GFR calc non Af Amer: >60 mL/min (ref >60)
GLUCOSE: 131 mg/dL — AB (ref 65–99)
Glucose, Bld: 310 mg/dL — ABNORMAL HIGH (ref 65–99)
POTASSIUM: 3.4 mmol/L — AB (ref 3.5–5.1)
POTASSIUM: 3.2 mmol/L — AB (ref 3.5–5.1)
SODIUM: 131 mmol/L — AB (ref 135–145)
Sodium: 135 mmol/L (ref 135–145)
TOTAL PROTEIN: 5.2 g/dL — AB (ref 6.5–8.1)
Total Bilirubin: 0.5 mg/dL (ref 0.3–1.2)
Total Protein: 6.2 g/dL — ABNORMAL LOW (ref 6.5–8.1)

## 2016-09-17 LAB — GLUCOSE, CAPILLARY
GLUCOSE-CAPILLARY: 93 mg/dL (ref 65–99)
Glucose-Capillary: 150 mg/dL — ABNORMAL HIGH (ref 65–99)
Glucose-Capillary: 140 mg/dL — ABNORMAL HIGH (ref 65–99)

## 2016-09-17 LAB — URINALYSIS, ROUTINE W REFLEX MICROSCOPIC
Bilirubin Urine: NEGATIVE
Glucose, UA: >=500 mg/dL — AB
HGB URINE DIPSTICK: NEGATIVE
Ketones, ur: 5 mg/dL — AB
Nitrite: NEGATIVE
PROTEIN: NEGATIVE mg/dL
Specific Gravity, Urine: 1.017 (ref 1.005–1.030)
pH: 5 (ref 5.0–8.0)

## 2016-09-17 LAB — LACTIC ACID, PLASMA
Lactic Acid, Venous: 1.7 mmol/L (ref 0.5–1.9)
Lactic Acid, Venous: 2.1 mmol/L (ref 0.5–1.9)

## 2016-09-17 LAB — APTT: aPTT: 35 seconds (ref 24–36)

## 2016-09-17 LAB — PROCALCITONIN: Procalcitonin: 0.18 ng/mL

## 2016-09-17 LAB — I-STAT CG4 LACTIC ACID, ED: Lactic Acid, Venous: 1.62 mmol/L (ref 0.5–1.9)

## 2016-09-17 LAB — PROTIME-INR
INR: 1.19
PROTHROMBIN TIME: 15.2 s (ref 11.4–15.2)

## 2016-09-17 LAB — MRSA PCR SCREENING: MRSA BY PCR: NEGATIVE

## 2016-09-17 MED ORDER — VANCOMYCIN HCL IN DEXTROSE 1-5 GM/200ML-% IV SOLN: 1000.0000 mg | Freq: Once | INTRAVENOUS | Status: DC

## 2016-09-17 MED ORDER — SODIUM CHLORIDE 0.9 % IV BOLUS (SEPSIS)
1000.0000 mL | Freq: Once | INTRAVENOUS | Status: AC
  Administered 2016-09-17: 1000 mL via INTRAVENOUS

## 2016-09-17 MED ORDER — DEXTROSE 5 % IV SOLN
2.0000 g | Freq: Three times a day (TID) | INTRAVENOUS | Status: DC
  Administered 2016-09-17 – 2016-09-18 (×3): 2 g via INTRAVENOUS
  Filled 2016-09-17 (×4): qty 2

## 2016-09-17 MED ORDER — ROSUVASTATIN CALCIUM 10 MG PO TABS
20.0000 mg | ORAL_TABLET | Freq: Every day | ORAL | Status: DC
  Administered 2016-09-17: 20 mg via ORAL
  Filled 2016-09-17: qty 2

## 2016-09-17 MED ORDER — SODIUM CHLORIDE 0.9 % IV SOLN
1000.0000 mL | INTRAVENOUS | Status: AC
  Administered 2016-09-17: 1000 mL via INTRAVENOUS

## 2016-09-17 MED ORDER — HYDRALAZINE HCL 20 MG/ML IJ SOLN: 10.0000 mg | INTRAMUSCULAR | Status: DC | PRN

## 2016-09-17 MED ORDER — INSULIN GLARGINE 100 UNIT/ML ~~LOC~~ SOLN
5.0000 [IU] | Freq: Every day | SUBCUTANEOUS | Status: DC
  Administered 2016-09-17 – 2016-09-18 (×2): 5 [IU] via SUBCUTANEOUS
  Filled 2016-09-17 (×2): qty 0.05

## 2016-09-17 MED ORDER — SODIUM CHLORIDE 0.9 % IV SOLN
1000.0000 mL | INTRAVENOUS | Status: DC
  Administered 2016-09-17: 1000 mL via INTRAVENOUS

## 2016-09-17 MED ORDER — METFORMIN HCL 500 MG PO TABS
1000.0000 mg | ORAL_TABLET | Freq: Once | ORAL | Status: AC
  Administered 2016-09-17: 1000 mg via ORAL
  Filled 2016-09-17: qty 2

## 2016-09-17 MED ORDER — DIPHENHYDRAMINE HCL 50 MG/ML IJ SOLN
25.0000 mg | Freq: Once | INTRAMUSCULAR | Status: AC
  Administered 2016-09-17: 25 mg via INTRAVENOUS
  Filled 2016-09-17: qty 1

## 2016-09-17 MED ORDER — ENOXAPARIN SODIUM 40 MG/0.4ML ~~LOC~~ SOLN
40.0000 mg | SUBCUTANEOUS | Status: DC
  Administered 2016-09-17 – 2016-09-18 (×2): 40 mg via SUBCUTANEOUS
  Filled 2016-09-17 (×2): qty 0.4

## 2016-09-17 MED ORDER — DEXTROSE 5 % IV SOLN
2.0000 g | Freq: Once | INTRAVENOUS | Status: AC
  Administered 2016-09-17: 2 g via INTRAVENOUS
  Filled 2016-09-17: qty 2

## 2016-09-17 MED ORDER — POTASSIUM CHLORIDE CRYS ER 20 MEQ PO TBCR
40.0000 meq | EXTENDED_RELEASE_TABLET | Freq: Four times a day (QID) | ORAL | Status: AC
  Administered 2016-09-17 (×2): 40 meq via ORAL
  Filled 2016-09-17 (×2): qty 2

## 2016-09-17 MED ORDER — TIMOLOL MALEATE 0.5 % OP SOLN
1.0000 [drp] | Freq: Every day | OPHTHALMIC | Status: DC
  Administered 2016-09-17 – 2016-09-18 (×2): 1 [drp] via OPHTHALMIC
  Filled 2016-09-17: qty 5

## 2016-09-17 MED ORDER — LEVOFLOXACIN IN D5W 750 MG/150ML IV SOLN
750.0000 mg | INTRAVENOUS | Status: DC
  Administered 2016-09-17: 750 mg via INTRAVENOUS
  Filled 2016-09-17: qty 150

## 2016-09-17 MED ORDER — ALPRAZOLAM 0.25 MG PO TABS
0.2500 mg | ORAL_TABLET | Freq: Once | ORAL | Status: AC
  Administered 2016-09-17: 0.25 mg via ORAL
  Filled 2016-09-17: qty 1

## 2016-09-17 MED ORDER — LEVOFLOXACIN IN D5W 750 MG/150ML IV SOLN
750.0000 mg | Freq: Once | INTRAVENOUS | Status: AC
  Administered 2016-09-17: 750 mg via INTRAVENOUS
  Filled 2016-09-17: qty 150

## 2016-09-17 MED ORDER — VANCOMYCIN HCL 10 G IV SOLR
2000.0000 mg | Freq: Once | INTRAVENOUS | Status: AC
  Administered 2016-09-17: 2000 mg via INTRAVENOUS
  Filled 2016-09-17: qty 2000

## 2016-09-17 MED ORDER — VANCOMYCIN HCL 10 G IV SOLR: 1250.0000 mg | INTRAVENOUS | Status: DC

## 2016-09-17 MED ORDER — INSULIN ASPART 100 UNIT/ML ~~LOC~~ SOLN
0.0000 [IU] | Freq: Three times a day (TID) | SUBCUTANEOUS | Status: DC
  Administered 2016-09-17: 1 [IU] via SUBCUTANEOUS
  Administered 2016-09-18: 2 [IU] via SUBCUTANEOUS

## 2016-09-17 NOTE — ED Notes (Signed)
Pt experiencing itching with reddening to face.  Pt denies any SHOB or trouble breathing at this time.  Admitting MD paged to be made aware- no PRN orders at this time.

## 2016-09-17 NOTE — Progress Notes (Signed)
CRITICAL VALUE ALERT  Critical Value:  Lactic Acid 2.1  Date & Time Notied:  09/17/16 @ 5883  Provider Notified: Candiss Norse, Md @ [phone]  Orders Received/Actions taken: No new orders at this time, continue IVF at 96mL/hr.

## 2016-09-17 NOTE — ED Notes (Signed)
Verbal order from admitting ED for Benadryl IV

## 2016-09-17 NOTE — H&P (Signed)
History and Physical    [name] UDJ:[id number] DOB: [birth year] DOA: 09/16/2016  PCP: Alroy Dust, L.Marlou Sa, MD  Patient coming from: Home.  Chief Complaint: Fever and chills.  HPI: [name] is a 71 y.o. female with history of hypertension, hyperlipidemia, diabetes mellitus presents to the ER because of fever and chills. Patient has been having fever and chills since yesterday morning. Denies any nausea vomiting diarrhea chest pain shortness of breath or productive cough. Since symptoms persisted patient came to the ER last evening.   ED Course: In the ER patient was febrile and tachycardic and lactate levels were elevated. UA showing possible UTI. Patient had a right facial cyst removal procedure done 2 days ago and on exam the site does not look infected. Patient denies any facial pain or neck pain or headache. Patient was started on empiric antibiotics after blood cultures were obtained and fluid was given as per sepsis protocol.  Review of Systems: As per HPI, rest all negative.   Past Medical History:  Diagnosis Date  . Depression   . Diabetes mellitus without complication ([place])   . Glaucoma    bilateral  . Hyperlipidemia   . Hypertension   . Macular degeneration   . Mental disorder   . SVD (spontaneous vaginal delivery)    x 3    Past Surgical History:  Procedure Laterality Date  . ANAL RECTAL MANOMETRY N/A 02/12/2014   Procedure: ANO RECTAL MANOMETRY;  Surgeon: Leighton Ruff, MD;  Location: [facility];  Service: Endoscopy;  Laterality: N/A;  . APPENDECTOMY    . EYE SURGERY     right eye   . EYE SURGERY     bilateral cataract eye surgery  . RECTOCELE REPAIR N/A 03/02/2013   Procedure: POSTERIOR REPAIR (RECTOCELE);  Surgeon: Margarette Asal, MD;  Location: [facility];  Service: Gynecology;  Laterality: N/A;  . TONSILLECTOMY    . TOTAL VAGINAL HYSTERECTOMY  2009  . VAGINAL HYSTERECTOMY  2009   TOT done as well     reports that she has quit smoking. Her  smoking use included Cigarettes. She has a 67.50 pack-year smoking history. She has never used smokeless tobacco. She reports that she does not drink alcohol or use drugs.  Allergies  Allergen Reactions  . Codeine Nausea And Vomiting  . Penicillins Swelling    Childhood reaction  . Sulfa Antibiotics Swelling    Childhood reaction  . Tylenol [Acetaminophen] Nausea And Vomiting    Family History  Problem Relation Age of Onset  . Hypertension Other     Prior to Admission medications   Medication Sig Start Date End Date Taking? Authorizing Provider  glimepiride (AMARYL) 4 MG tablet Take 4 mg by mouth daily with breakfast.   Yes [provider]  lisinopril-hydrochlorothiazide (PRINZIDE,ZESTORETIC) 20-12.5 MG per tablet Take 1 tablet by mouth daily.   Yes [provider]  metFORMIN (GLUCOPHAGE) 1000 MG tablet Take 1,000 mg by mouth 2 (two) times daily. 07/13/16  Yes [provider]  Multiple Vitamin (MULTIVITAMIN WITH MINERALS) TABS tablet Take 1 tablet by mouth daily.   Yes [provider]  rosuvastatin (CRESTOR) 20 MG tablet Take 20 mg by mouth daily.   Yes [provider]  timolol (TIMOPTIC) 0.5 % ophthalmic solution Place 1 drop into both eyes daily.   Yes [provider]  docusate sodium (COLACE) 100 MG capsule Take 1 capsule (100 mg total) by mouth 2 (two) times daily. Patient not taking: Reported on 09/17/2016 03/03/13  Molli Posey, MD  HYDROcodone-ibuprofen (VICOPROFEN) 7.5-200 MG per tablet Take 1 tablet by mouth every 8 (eight) hours as needed for moderate pain. Patient not taking: Reported on 09/17/2016 03/03/13   Molli Posey, MD  ibuprofen (ADVIL,MOTRIN) 800 MG tablet Take 1 tablet (800 mg total) by mouth every 8 (eight) hours as needed (mild pain). Patient not taking: Reported on 09/17/2016 03/03/13   Molli Posey, MD    Physical Exam: Vitals:   09/17/16 0200 09/17/16 0215 09/17/16 0245 09/17/16 0305  BP:  111/66 (!) 160/68 (!) 136/50   Pulse: 98 (!) 108 (!) 101   Resp: (!) 28 15 18    Temp:    98.7 F (37.1 C)  TempSrc:      SpO2: 99% 98% 100%   Weight:      Height:          Constitutional: Moderately built and nourished. Vitals:   09/17/16 0200 09/17/16 0215 09/17/16 0245 09/17/16 0305  BP: 111/66 (!) 160/68 (!) 136/50   Pulse: 98 (!) 108 (!) 101   Resp: (!) 28 15 18    Temp:    98.7 F (37.1 C)  TempSrc:      SpO2: 99% 98% 100%   Weight:      Height:       Eyes: Anicteric. No pallor. ENMT: Sutures on the right side of the face. Neck: No neck rigidity no mass felt. Respiratory: No rhonchi or crepitations. Cardiovascular: S1-S2. No murmurs appreciated. Abdomen: Soft nontender bowel sounds present. Musculoskeletal: No edema. No joint effusion. Skin: No rash. Skin appears warm. Neurologic: Alert awake oriented to time place and person. Moves all extremities. Psychiatric: Appears normal. Normal affect.   Labs on Admission: I have personally reviewed following labs and imaging studies  CBC:  Recent Labs Lab 09/16/16 2311  WBC 5.8  NEUTROABS 5.1  HGB 12.5  HCT 38.1  MCV 90.9  PLT 810   Basic Metabolic Panel:  Recent Labs Lab 09/16/16 2311  NA 131*  K 3.4*  CL 99*  CO2 20*  GLUCOSE 310*  BUN 18  CREATININE 1.09*  CALCIUM 8.2*   GFR: Estimated Creatinine Clearance: 51.6 mL/min (A) (by C-G formula based on SCr of 1.09 mg/dL (H)). Liver Function Tests:  Recent Labs Lab 09/16/16 2311  AST 36  ALT 17  ALKPHOS 47  BILITOT 0.5  PROT 6.2*  ALBUMIN 3.3*   No results for input(s): LIPASE, AMYLASE in the last 168 hours. No results for input(s): AMMONIA in the last 168 hours. Coagulation Profile:  Recent Labs Lab 09/16/16 2311  INR 1.08   Cardiac Enzymes: No results for input(s): CKTOTAL, CKMB, CKMBINDEX, TROPONINI in the last 168 hours. BNP (last 3 results) No results for input(s): PROBNP in the last 8760 hours. HbA1C: No results for input(s):  HGBA1C in the last 72 hours. CBG: No results for input(s): GLUCAP in the last 168 hours. Lipid Profile: No results for input(s): CHOL, HDL, LDLCALC, TRIG, CHOLHDL, LDLDIRECT in the last 72 hours. Thyroid Function Tests: No results for input(s): TSH, T4TOTAL, FREET4, T3FREE, THYROIDAB in the last 72 hours. Anemia Panel: No results for input(s): VITAMINB12, FOLATE, FERRITIN, TIBC, IRON, RETICCTPCT in the last 72 hours. Urine analysis:    Component Value Date/Time   COLORURINE YELLOW 09/17/2016 0049   APPEARANCEUR HAZY (A) 09/17/2016 0049   LABSPEC 1.017 09/17/2016 0049   PHURINE 5.0 09/17/2016 0049   GLUCOSEU >=500 (A) 09/17/2016 0049   HGBUR NEGATIVE 09/17/2016 [place] 09/17/2016 0049  KETONESUR 5 (A) 09/17/2016 0049   PROTEINUR NEGATIVE 09/17/2016 0049   NITRITE NEGATIVE 09/17/2016 0049   LEUKOCYTESUR MODERATE (A) 09/17/2016 0049   Sepsis Labs: @LABRCNTIP (procalcitonin:4,lacticidven:4) )No results found for this or any previous visit (from the past 240 hour(s)).   Radiological Exams on Admission: Dg Chest 2 View  Result Date: 09/16/2016 CLINICAL DATA:  Sepsis, dyspnea and dizziness EXAM: CHEST  2 VIEW COMPARISON:  06/06/2008 FINDINGS: The heart size and mediastinal contours are within normal limits. Mild hyperinflation of the lungs, upper lobe predominant with slight crowding of lower lobe interstitial lung markings. No pneumonic consolidation, CHF, effusion or pneumothorax. The visualized skeletal structures are unremarkable. IMPRESSION: Mild bilateral upper lobe emphysematous hyperinflation with crowding of lower lobe interstitial lung markings. Electronically Signed   By: Ashley Royalty M.D.   On: 09/16/2016 23:43     Assessment/Plan Principal Problem:   Sepsis ([place]) Active Problems:   Acute lower UTI   Uncontrolled type 2 diabetes mellitus with hyperglycemia ([place])   Essential hypertension    1. Sepsis - source not clearly could be from UTI. For now we  will continue with empiric antibiotics until culture results are available. Continue with hydration. Follow lactate and procalcitonin levels. 2. Diabetes mellitus type 2 uncontrolled with hyperglycemia - patient states she didn't take her home dose of oral hypoglycemics yesterday due to her symptoms. For now I have placed patient on Lantus 5 units with sliding scale coverage. Follow CBGs. 3. Hypertension - holding off lisinopril and hydrochlorothiazide for now and keeping patient on when necessary IV hydralazine for systolic blood pressure more than 160. For blood pressure trends. 4. Hyperlipidemia on statins.   DVT prophylaxis: Lovenox. Code Status: Full code.  Family Communication: Discussed with patient.  Disposition Plan: Home.  Consults called: None.  Admission status: Inpatient.    Rise Patience MD [facility] Pager [phone].  If 7PM-7AM, please contact night-coverage [email or web address] Password [email or web address]  09/17/2016, 3:12 AM

## 2016-09-17 NOTE — Progress Notes (Signed)
@[email or web address]         PROGRESS NOTE                                                                                                                                                                                                             Patient Demographics:    [name], is a 71 y.o. female, DOB - 14-Sep-1945, UMP:[id number]  Admit date - 09/16/2016   Admitting Physician Rise Patience, MD  Outpatient Primary MD for the patient is Alroy Dust, L.Marlou Sa, MD  LOS - 0  No chief complaint on file.      Brief Narrative  [name] is a 71 y.o. female with history of hypertension, hyperlipidemia, diabetes mellitus presents to the ER because of fever and chills. Patient has been having fever and chills since yesterday morning. Denies any nausea vomiting diarrhea chest pain shortness of breath or productive cough. Since symptoms persisted patient came to the ER last evening.  in the ER workup suggestive of possible UTI and she was admitted.    Subjective:    Marlean Mortell today has, No headache, No chest pain, No abdominal pain - No Nausea, No new weakness tingling or numbness, No Cough - SOB.     Assessment  & Plan :     1.Possible mild sepsis likely from UTI. Lactate borderline, she appears nontoxic and stable, discontinue vancomycin as she had bad reaction in the ER with diffuse itching and redness all over resolved with Benadryl, continue Levaquin and meropenem, follow cultures, gentle hydration and monitor. Postop site on the face appears clean.  2. Hypertension. Blood pressure borderline off but pressure medications, will order as needed hydralazine.  3. Hypokalemia. Replace.  4. DM type II. On Lantus and sliding scale. Hold Glucophage.  No results found for: HGBA1C CBG (last 3)  No results for input(s): GLUCAP in the last 72 hours.    Diet : Diet heart healthy/carb modified Room service appropriate? Yes; Fluid consistency: Thin    Family Communication  :  None  Code  Status :  Full  Disposition Plan  :  TBD  Consults  :  None  Procedures  :  None  DVT Prophylaxis  :  Lovenox    Lab Results  Component Value Date   PLT 170 09/17/2016    Inpatient Medications  Scheduled Meds: . enoxaparin (LOVENOX) injection  40 mg Subcutaneous Q24H  . insulin aspart  0-9 Units Subcutaneous TID WC  . insulin glargine  5 Units Subcutaneous Daily  . rosuvastatin  20 mg Oral q1800  . timolol  1 drop Both Eyes Daily   Continuous Infusions: . sodium chloride 1,000 mL (09/17/16 0737)  . aztreonam    . levofloxacin (LEVAQUIN) IV     PRN Meds:.hydrALAZINE  Antibiotics  :    Anti-infectives    Start     Dose/Rate Route Frequency Ordered Stop   09/18/16 0500  vancomycin (VANCOCIN) 1,250 mg in sodium chloride 0.9 % 250 mL IVPB  Status:  Discontinued     1,250 mg 166.7 mL/hr over 90 Minutes Intravenous Every 24 hours 09/17/16 0319 09/17/16 0734   09/17/16 2200  levofloxacin (LEVAQUIN) IVPB 750 mg     750 mg 100 mL/hr over 90 Minutes Intravenous Every 24 hours 09/17/16 0319     09/17/16 1400  aztreonam (AZACTAM) 2 g in dextrose 5 % 50 mL IVPB     2 g 100 mL/hr over 30 Minutes Intravenous Every 8 hours 09/17/16 0319     09/17/16 0030  levofloxacin (LEVAQUIN) IVPB 750 mg     750 mg 100 mL/hr over 90 Minutes Intravenous  Once 09/17/16 0022 09/17/16 0247   09/17/16 0030  aztreonam (AZACTAM) 2 g in dextrose 5 % 50 mL IVPB     2 g 100 mL/hr over 30 Minutes Intravenous  Once 09/17/16 0022 09/17/16 0323   09/17/16 0030  vancomycin (VANCOCIN) IVPB 1000 mg/200 mL premix  Status:  Discontinued     1,000 mg 200 mL/hr over 60 Minutes Intravenous  Once 09/17/16 0022 09/17/16 0029   09/17/16 0030  vancomycin (VANCOCIN) 2,000 mg in sodium chloride 0.9 % 500 mL IVPB     2,000 mg 250 mL/hr over 120 Minutes Intravenous  Once 09/17/16 0029 09/17/16 0728         Objective:   Vitals:   09/17/16 0715 09/17/16 0730 09/17/16 0747 09/17/16 0900  BP: (!) 112/42 128/69     Pulse: (!) 108 91    Resp: 20 12    Temp:   98.6 F (37 C) 98.8 F (37.1 C)  TempSrc:   Oral Oral  SpO2: 92% 100%    Weight:    102.2 kg (225 lb 3.2 oz)  Height:    5' (1.524 m)    Wt Readings from Last 3 Encounters:  09/17/16 102.2 kg (225 lb 3.2 oz)  03/01/13 98 kg (216 lb)  02/27/13 98 kg (216 lb)     Intake/Output Summary (Last 24 hours) at 09/17/16 0925 Last data filed at 09/17/16 0744  Gross per 24 hour  Intake          3356.25 ml  Output              250 ml  Net          3106.25 ml     Physical Exam  Awake Alert, Oriented X 3, No new F.N deficits, Normal affect [place].AT,PERRAL, Facial postop scar site stable Supple Neck,No JVD, No cervical lymphadenopathy appriciated.  Symmetrical Chest wall movement, Good air movement bilaterally, CTAB RRR,No Gallops,Rubs or new Murmurs, No Parasternal Heave +ve B.Sounds, Abd Soft, No tenderness, No organomegaly appriciated, No rebound - guarding or rigidity. No Cyanosis, Clubbing or edema, No new Rash or bruise      Data Review:    CBC  Recent Labs Lab 09/16/16 2311 09/17/16 0425  WBC 5.8 4.9  HGB 12.5 10.8*  HCT 38.1 33.1*  PLT 212 170  MCV 90.9 90.2  MCH 29.8 29.4  MCHC 32.8 32.6  RDW  14.3 14.1  LYMPHSABS 0.5* 0.7  MONOABS 0.2 0.3  EOSABS 0.0 0.0  BASOSABS 0.0 0.0    Chemistries   Recent Labs Lab 09/16/16 2311 09/17/16 0425  NA 131* 135  K 3.4* 3.2*  CL 99* 104  CO2 20* 24  GLUCOSE 310* 131*  BUN 18 15  CREATININE 1.09* 0.85  CALCIUM 8.2* 7.5*  AST 36 24  ALT 17 13*  ALKPHOS 47 39  BILITOT 0.5 0.3   ------------------------------------------------------------------------------------------------------------------ No results for input(s): CHOL, HDL, LDLCALC, TRIG, CHOLHDL, LDLDIRECT in the last 72 hours.  No results found for: HGBA1C ------------------------------------------------------------------------------------------------------------------ No results for input(s): TSH, T4TOTAL,  T3FREE, THYROIDAB in the last 72 hours.  Invalid input(s): FREET3 ------------------------------------------------------------------------------------------------------------------ No results for input(s): VITAMINB12, FOLATE, FERRITIN, TIBC, IRON, RETICCTPCT in the last 72 hours.  Coagulation profile  Recent Labs Lab 09/16/16 2311 09/17/16 0425  INR 1.08 1.19    No results for input(s): DDIMER in the last 72 hours.  Cardiac Enzymes No results for input(s): CKMB, TROPONINI, MYOGLOBIN in the last 168 hours.  Invalid input(s): CK ------------------------------------------------------------------------------------------------------------------ No results found for: BNP  Micro Results No results found for this or any previous visit (from the past 240 hour(s)).  Radiology Reports Dg Chest 2 View  Result Date: 09/16/2016 CLINICAL DATA:  Sepsis, dyspnea and dizziness EXAM: CHEST  2 VIEW COMPARISON:  06/06/2008 FINDINGS: The heart size and mediastinal contours are within normal limits. Mild hyperinflation of the lungs, upper lobe predominant with slight crowding of lower lobe interstitial lung markings. No pneumonic consolidation, CHF, effusion or pneumothorax. The visualized skeletal structures are unremarkable. IMPRESSION: Mild bilateral upper lobe emphysematous hyperinflation with crowding of lower lobe interstitial lung markings. Electronically Signed   By: Ashley Royalty M.D.   On: 09/16/2016 23:43    Time Spent in minutes  30   Lala Lund M.D on 09/17/2016 at 9:25 AM  Between 7am to 7pm - Pager - [phone] ( page via [email or web address], text pages only, please mention full 10 digit call back number). After 7pm go to [email or web address] - password [facility]

## 2016-09-17 NOTE — ED Provider Notes (Signed)
[facility] DEPT Provider Note   CSN: [id number] Arrival date & time: 09/16/16  2251  By signing my name below, I, Margit Banda, attest that this documentation has been prepared under the direction and in the presence of Jennett Tarbell, Annie Main, MD. Electronically Signed: Margit Banda, ED Scribe. 09/17/16. 12:22 AM.  History   Chief Complaint No chief complaint on file.   HPI [name] is a 71 y.o. female who presents to the Emergency Department complaining of sudden "shakiness" that started in the evening of 09/15/16. Pt reports have two cysts surgically removed on Tuesday, 09/15/16. Associated sx include fever, dry mouth, nausea, HA, achy, weakness, and SOB. Tylenol makes her sick. Her grandson has been sick. Pt denies, cough, dysuria, hematuria, vomiting, diarrhea, and abdominal pain.  PCP: Alroy Dust, L.Marlou Sa, MD  The history is provided by the patient. No language interpreter was used.    Past Medical History:  Diagnosis Date  . Depression   . Diabetes mellitus without complication ([place])   . Glaucoma    bilateral  . Hyperlipidemia   . Hypertension   . Macular degeneration   . Mental disorder   . SVD (spontaneous vaginal delivery)    x 3    There are no active problems to display for this patient.   Past Surgical History:  Procedure Laterality Date  . ANAL RECTAL MANOMETRY N/A 02/12/2014   Procedure: ANO RECTAL MANOMETRY;  Surgeon: Leighton Ruff, MD;  Location: [facility];  Service: Endoscopy;  Laterality: N/A;  . APPENDECTOMY    . EYE SURGERY     right eye   . EYE SURGERY     bilateral cataract eye surgery  . RECTOCELE REPAIR N/A 03/02/2013   Procedure: POSTERIOR REPAIR (RECTOCELE);  Surgeon: Margarette Asal, MD;  Location: [facility];  Service: Gynecology;  Laterality: N/A;  . TONSILLECTOMY    . TOTAL VAGINAL HYSTERECTOMY  2009  . VAGINAL HYSTERECTOMY  2009   TOT done as well    OB History    No data available       Home Medications    Prior to  Admission medications   Medication Sig Start Date End Date Taking? Authorizing Provider  glimepiride (AMARYL) 4 MG tablet Take 4 mg by mouth daily with breakfast.   Yes [provider]  lisinopril-hydrochlorothiazide (PRINZIDE,ZESTORETIC) 20-12.5 MG per tablet Take 1 tablet by mouth daily.   Yes [provider]  metFORMIN (GLUCOPHAGE) 1000 MG tablet Take 1,000 mg by mouth 2 (two) times daily. 07/13/16  Yes [provider]  Multiple Vitamin (MULTIVITAMIN WITH MINERALS) TABS tablet Take 1 tablet by mouth daily.   Yes [provider]  rosuvastatin (CRESTOR) 20 MG tablet Take 20 mg by mouth daily.   Yes [provider]  timolol (TIMOPTIC) 0.5 % ophthalmic solution Place 1 drop into both eyes daily.   Yes [provider]  docusate sodium (COLACE) 100 MG capsule Take 1 capsule (100 mg total) by mouth 2 (two) times daily. Patient not taking: Reported on 09/17/2016 03/03/13   Molli Posey, MD  HYDROcodone-ibuprofen (VICOPROFEN) 7.5-200 MG per tablet Take 1 tablet by mouth every 8 (eight) hours as needed for moderate pain. Patient not taking: Reported on 09/17/2016 03/03/13   Molli Posey, MD  ibuprofen (ADVIL,MOTRIN) 800 MG tablet Take 1 tablet (800 mg total) by mouth every 8 (eight) hours as needed (mild pain). Patient not taking: Reported on 09/17/2016 03/03/13   Molli Posey, MD    Family History No family history on file.  Social History Social History  Substance Use Topics  . Smoking status: Former Smoker    Packs/day: 1.50    Years: 45.00    Types: Cigarettes  . Smokeless tobacco: Never Used  . Alcohol use No     Allergies   Codeine; Penicillins; Sulfa antibiotics; and Tylenol [acetaminophen]   Review of Systems Review of Systems   Physical Exam Updated Vital Signs BP (!) 160/68   Pulse (!) 108   Temp (!) 100.7 F (38.2 C) (Oral)   Resp 15   Ht 5' (1.524 m)   Wt 225 lb (102.1 kg)   SpO2 98%   BMI 43.94 kg/m     Physical Exam  Constitutional: She is oriented to person, place, and time. She appears well-developed and well-nourished. No distress.  Non toxic appearing.  HENT:  Head: Normocephalic and atraumatic.  Mouth/Throat: No oropharyngeal exudate.  Small incision to right cheek appears clean.   Eyes: Conjunctivae and EOM are normal. Pupils are equal, round, and reactive to light.  Neck: Normal range of motion. Neck supple.  No meningismus.  Cardiovascular: Normal rate, regular rhythm, normal heart sounds and intact distal pulses.   No murmur heard. Pulmonary/Chest: Effort normal. No respiratory distress. She has decreased breath sounds.  Abdominal: Soft. There is no tenderness. There is no rebound and no guarding.  Musculoskeletal: Normal range of motion. She exhibits no edema or tenderness.  Neurological: She is alert and oriented to person, place, and time. No cranial nerve deficit. She exhibits normal muscle tone. Coordination normal.   5/5 strength throughout. CN 2-12 intact.Equal grip strength.   Skin: Skin is warm.  Psychiatric: She has a normal mood and affect. Her behavior is normal.  Nursing note and vitals reviewed.   ED Treatments / Results  DIAGNOSTIC STUDIES: Oxygen Saturation is 97% on RA, normal by my interpretation.   COORDINATION OF CARE: 12:22 AM-Discussed next steps with pt which includes being watched over night because she is dehydrated and has a urinary tract infection. Pt verbalized understanding and is agreeable with the plan.   Labs (all labs ordered are listed, but only abnormal results are displayed) Labs Reviewed  COMPREHENSIVE METABOLIC PANEL - Abnormal; Notable for the following:       Result Value   Sodium 131 (*)    Potassium 3.4 (*)    Chloride 99 (*)    CO2 20 (*)    Glucose, Bld 310 (*)    Creatinine, Ser 1.09 (*)    Calcium 8.2 (*)    Total Protein 6.2 (*)    Albumin 3.3 (*)    GFR calc non Af Amer 50 (*)    GFR calc Af Amer 58 (*)     All other components within normal limits  CBC WITH DIFFERENTIAL/PLATELET - Abnormal; Notable for the following:    Lymphs Abs 0.5 (*)    All other components within normal limits  URINALYSIS, ROUTINE W REFLEX MICROSCOPIC - Abnormal; Notable for the following:    APPearance HAZY (*)    Glucose, UA >=500 (*)    Ketones, ur 5 (*)    Leukocytes, UA MODERATE (*)    Bacteria, UA RARE (*)    Squamous Epithelial / LPF 0-5 (*)    All other components within normal limits  I-STAT CG4 LACTIC ACID, ED - Abnormal; Notable for the following:    Lactic Acid, Venous 4.59 (*)    All other components within normal limits  CULTURE, BLOOD (ROUTINE X 2)  CULTURE,  BLOOD (ROUTINE X 2)  URINE CULTURE  PROTIME-INR  I-STAT CG4 LACTIC ACID, ED    EKG  EKG Interpretation  Date/Time:  Thursday Sep 17 2016 00:37:56 EDT Ventricular Rate:  102 PR Interval:    QRS Duration: 95 QT Interval:  336 QTC Calculation: 438 R Axis:   58 Text Interpretation:  Sinus tachycardia Low voltage, precordial leads Rate faster Confirmed by Ezequiel Essex [phone]) on 09/17/2016 1:05:49 AM       Radiology Dg Chest 2 View  Result Date: 09/16/2016 CLINICAL DATA:  Sepsis, dyspnea and dizziness EXAM: CHEST  2 VIEW COMPARISON:  06/06/2008 FINDINGS: The heart size and mediastinal contours are within normal limits. Mild hyperinflation of the lungs, upper lobe predominant with slight crowding of lower lobe interstitial lung markings. No pneumonic consolidation, CHF, effusion or pneumothorax. The visualized skeletal structures are unremarkable. IMPRESSION: Mild bilateral upper lobe emphysematous hyperinflation with crowding of lower lobe interstitial lung markings. Electronically Signed   By: Ashley Royalty M.D.   On: 09/16/2016 23:43    Procedures Procedures (including critical care time)  Medications Ordered in ED Medications  0.9 %  sodium chloride infusion (not administered)  aztreonam (AZACTAM) 2 g in dextrose 5 % 50 mL IVPB  (not administered)  sodium chloride 0.9 % bolus 1,000 mL (not administered)  vancomycin (VANCOCIN) 2,000 mg in sodium chloride 0.9 % 500 mL IVPB (not administered)  ALPRAZolam (XANAX) tablet 0.25 mg (not administered)  metFORMIN (GLUCOPHAGE) tablet 1,000 mg (not administered)  levofloxacin (LEVAQUIN) IVPB 750 mg (750 mg Intravenous New Bag/Given 09/17/16 0054)  sodium chloride 0.9 % bolus 1,000 mL (0 mLs Intravenous Stopped 09/17/16 0229)     Initial Impression / Assessment and Plan / ED Course  I have reviewed the triage vital signs and the nursing notes.  Pertinent labs & imaging results that were available during my care of the patient were reviewed by me and considered in my medical decision making (see chart for details).    Patient presents with shakiness, generalized weakness, nausea and general feeling of unwellness started this evening. Had dermatology procedure and 2 cysts removed from face yesterday. She denies any focal symptoms has no urinary symptoms and no cough. Denies any pain  Patient found to have elevated lactate, tachycardia, fever. Code sepsis activated. Patient given broad-spectrum antibiotics after cultures are obtained.  Mental status and BP normal. IVF and IV antibiotics given.  UA appears likely source.  With elevated lactate and ongoing symptoms, plan admission for sepsis.  D/w Dr. Alcario Drought.  Final Clinical Impressions(s) / ED Diagnoses   Final diagnoses:  Sepsis, due to unspecified organism [facility])  Urinary tract infection without hematuria, site unspecified    New Prescriptions New Prescriptions   No medications on file  I personally performed the services described in this documentation, which was scribed in my presence. The recorded information has been reviewed and is accurate.    Ezequiel Essex, MD 09/17/16 [phone]

## 2016-09-17 NOTE — Progress Notes (Signed)
Pharmacy Antibiotic Note  [name] is a 71 y.o. female admitted on 09/16/2016 with sepsis.  Pharmacy has been consulted for Levaquin, Aztreonam, and Vancomycin dosing.  Plan: Aztreonam 2gm IV q8h Vancomycin 2gm IV now then 1250mg  IV q24h Levaquin 750mg  IV q24h Will f/u micro data, renal function, and pt's clinical condition Vanc trough prn   Height: 5' (152.4 cm) Weight: 225 lb (102.1 kg) IBW/kg (Calculated) : 45.5  Temp (24hrs), Avg:99.8 F (37.7 C), Min:98.7 F (37.1 C), Max:100.7 F (38.2 C)   Recent Labs Lab 09/16/16 2311 09/16/16 2337  WBC 5.8  --   CREATININE 1.09*  --   LATICACIDVEN  --  4.59*    Estimated Creatinine Clearance: 51.6 mL/min (A) (by C-G formula based on SCr of 1.09 mg/dL (H)).    Allergies  Allergen Reactions  . Codeine Nausea And Vomiting  . Penicillins Swelling    Childhood reaction  . Sulfa Antibiotics Swelling    Childhood reaction  . Tylenol [Acetaminophen] Nausea And Vomiting    Antimicrobials this admission: 5/31 Aztreonam >>  5/31 Vanc >>  5/31 Levaquin  Dose adjustments this admission: n/a  Microbiology results: 5/30 BCx x2:  5/31 UCx:    Thank you for allowing pharmacy to be a part of this patient's care.  Sherlon Handing, PharmD, BCPS Clinical pharmacist, pager [phone] 09/17/2016 3:14 AM

## 2016-09-18 LAB — BASIC METABOLIC PANEL
Anion gap: 7 (ref 5–15)
BUN: 10 mg/dL (ref 6–20)
CALCIUM: 7.9 mg/dL — AB (ref 8.9–10.3)
CO2: 24 mmol/L (ref 22–32)
CREATININE: 0.83 mg/dL (ref 0.44–1.00)
Chloride: 109 mmol/L (ref 101–111)
GFR calc non Af Amer: >60 mL/min (ref >60)
Glucose, Bld: 153 mg/dL — ABNORMAL HIGH (ref 65–99)
Potassium: 4.2 mmol/L (ref 3.5–5.1)
SODIUM: 140 mmol/L (ref 135–145)

## 2016-09-18 LAB — LACTIC ACID, PLASMA: LACTIC ACID, VENOUS: 1.3 mmol/L (ref 0.5–1.9)

## 2016-09-18 LAB — URINE CULTURE

## 2016-09-18 LAB — CBC
HCT: 36 % (ref 36.0–46.0)
Hemoglobin: 11.6 g/dL — ABNORMAL LOW (ref 12.0–15.0)
MCH: 29.6 pg (ref 26.0–34.0)
MCHC: 32.2 g/dL (ref 30.0–36.0)
MCV: 91.8 fL (ref 78.0–100.0)
PLATELETS: 189 10*3/uL (ref 150–400)
RBC: 3.92 MIL/uL (ref 3.87–5.11)
RDW: 14.8 % (ref 11.5–15.5)
WBC: 4.4 10*3/uL (ref 4.0–10.5)

## 2016-09-18 LAB — GLUCOSE, CAPILLARY: GLUCOSE-CAPILLARY: 158 mg/dL — AB (ref 65–99)

## 2016-09-18 LAB — HEMOGLOBIN A1C
HEMOGLOBIN A1C: 7.5 % — AB (ref 4.8–5.6)
MEAN PLASMA GLUCOSE: 169 mg/dL

## 2016-09-18 MED ORDER — METFORMIN HCL 1000 MG PO TABS: 1000.0000 mg | ORAL_TABLET | Freq: Two times a day (BID) | ORAL | Status: AC

## 2016-09-18 MED ORDER — LEVOFLOXACIN 750 MG PO TABS: 750.0000 mg | ORAL_TABLET | ORAL | Status: DC

## 2016-09-18 MED ORDER — LEVOFLOXACIN 750 MG PO TABS: 750.0000 mg | ORAL_TABLET | Freq: Every day | ORAL | 0 refills | Status: AC

## 2016-09-18 NOTE — Discharge Summary (Signed)
[name] PJS:[id number] DOB: 1945-09-15 DOA: 09/16/2016  PCP: Alroy Dust, L.Marlou Sa, MD  Admit date: 09/16/2016  Discharge date: 09/18/2016  Admitted From: Home   Disposition:  Home   Recommendations for Outpatient Follow-up:   Follow up with PCP in 1-2 weeks  PCP Please obtain BMP/CBC, 2 view CXR in 1week,  (see Discharge instructions)   PCP Please follow up on the following pending results: Follow final blood culture results   Home Health: None Equipment/Devices: None Consultations: None Discharge Condition: Stable   CODE STATUS: Full   Diet Recommendation: Diet heart healthy/carb modified     No chief complaint on file.    Brief history of present illness from the day of admission and additional interim summary    [name] a 71 y.o.femalewith history of hypertension, hyperlipidemia, diabetes mellitus presents to the ER because of fever and chills. Patient has been having fever and chills since yesterday morning. Denies any nausea vomiting diarrhea chest pain shortness of breath or productive cough. Since symptoms persisted patient came to the ER last evening. in the ER workup suggestive of possible UTI and she was admitted.                                                                 Hospital Course   1.Possible mild sepsis likely from UTI. Lactate was borderline, she appeared nontoxic throughout, she received gentle IV fluids, empiric IV meropenem and Levaquin, urine cultures were inconclusive as sample was probably not of good quality, blood cultures negative so far. We will give her 6 more days of oral Levaquin and discharged home. Request PCP to monitor final blood culture results. She is completely symptom free back to baseline and eager to go home. Postop site on the face appears  clean.  2. Hypertension. Resume home regimen.  3. Hypokalemia. Replaced and stable.  4. DM type II. Resume home regimen commenced Glucophage from tomorrow.    Discharge diagnosis     Principal Problem:   Sepsis ([place]) Active Problems:   Acute lower UTI   Uncontrolled type 2 diabetes mellitus with hyperglycemia [facility])   Essential hypertension    Discharge instructions    Discharge Instructions    Discharge instructions    Complete by:  As directed    Follow with Primary MD Alroy Dust, L.Marlou Sa, MD in 7 days   Get CBC, CMP, 2 view Chest X ray checked  by Primary MD or SNF MD in 5-7 days ( we routinely change or add medications that can affect your baseline labs and fluid status, therefore we recommend that you get the mentioned basic workup next visit with your PCP, your PCP may decide not to get them or add new tests based on their clinical decision)  Activity: As tolerated with Full fall precautions use  walker/cane & assistance as needed  Disposition Home    Diet:   Diet heart healthy/carb modified .  For Heart failure patients - Check your Weight same time everyday, if you gain over 2 pounds, or you develop in leg swelling, experience more shortness of breath or chest pain, call your Primary MD immediately. Follow Cardiac Low Salt Diet and 1.5 lit/day fluid restriction.  On your next visit with your primary care physician please Get Medicines reviewed and adjusted.  Please request your Prim.MD to go over all Hospital Tests and Procedure/Radiological results at the follow up, please get all Hospital records sent to your Prim MD by signing hospital release before you go home.  If you experience worsening of your admission symptoms, develop shortness of breath, life threatening emergency, suicidal or homicidal thoughts you must seek medical attention immediately by calling 911 or calling your MD immediately  if symptoms less severe.  You Must read complete  instructions/literature along with all the possible adverse reactions/side effects for all the Medicines you take and that have been prescribed to you. Take any new Medicines after you have completely understood and accpet all the possible adverse reactions/side effects.   Do not drive, operate heavy machinery, perform activities at heights, swimming or participation in water activities or provide baby sitting services if your were admitted for syncope or siezures until you have seen by Primary MD or a Neurologist and advised to do so again.  Do not drive when taking Pain medications.    Do not take more than prescribed Pain, Sleep and Anxiety Medications  Special Instructions: If you have smoked or chewed Tobacco  in the last 2 yrs please stop smoking, stop any regular Alcohol  and or any Recreational drug use.  Wear Seat belts while driving.   Please note  You were cared for by a hospitalist during your hospital stay. If you have any questions about your discharge medications or the care you received while you were in the hospital after you are discharged, you can call the unit and asked to speak with the hospitalist on call if the hospitalist that took care of you is not available. Once you are discharged, your primary care physician will handle any further medical issues. Please note that NO REFILLS for any discharge medications will be authorized once you are discharged, as it is imperative that you return to your primary care physician (or establish a relationship with a primary care physician if you do not have one) for your aftercare needs so that they can reassess your need for medications and monitor your lab values.   Increase activity slowly    Complete by:  As directed       Discharge Medications   Allergies as of 09/18/2016      Reactions   Codeine Nausea And Vomiting   Penicillins Swelling   Childhood reaction   Sulfa Antibiotics Swelling   Childhood reaction   Tylenol  [acetaminophen] Nausea And Vomiting      Medication List    TAKE these medications   docusate sodium 100 MG capsule Commonly known as:  COLACE Take 1 capsule (100 mg total) by mouth 2 (two) times daily.   glimepiride 4 MG tablet Commonly known as:  AMARYL Take 4 mg by mouth daily with breakfast.   HYDROcodone-ibuprofen 7.5-200 MG tablet Commonly known as:  VICOPROFEN Take 1 tablet by mouth every 8 (eight) hours as needed for moderate pain.   ibuprofen 800 MG tablet Commonly known  as:  ADVIL,MOTRIN Take 1 tablet (800 mg total) by mouth every 8 (eight) hours as needed (mild pain).   levofloxacin 750 MG tablet Commonly known as:  LEVAQUIN Take 1 tablet (750 mg total) by mouth daily.   lisinopril-hydrochlorothiazide 20-12.5 MG tablet Commonly known as:  PRINZIDE,ZESTORETIC Take 1 tablet by mouth daily.   metFORMIN 1000 MG tablet Commonly known as:  GLUCOPHAGE Take 1 tablet (1,000 mg total) by mouth 2 (two) times daily. Start taking on:  09/19/2016   multivitamin with minerals Tabs tablet Take 1 tablet by mouth daily.   rosuvastatin 20 MG tablet Commonly known as:  CRESTOR Take 20 mg by mouth daily.   timolol 0.5 % ophthalmic solution Commonly known as:  TIMOPTIC Place 1 drop into both eyes daily.       Follow-up Information    Alroy Dust, L.Marlou Sa, MD. Schedule an appointment as soon as possible for a visit in 1 week(s).   Specialty:  Family Medicine Contact information: [address] [place] [address] [phone]           Major procedures and Radiology Reports - PLEASE review detailed and final reports thoroughly  -         Dg Chest 2 View  Result Date: 09/16/2016 CLINICAL DATA:  Sepsis, dyspnea and dizziness EXAM: CHEST  2 VIEW COMPARISON:  06/06/2008 FINDINGS: The heart size and mediastinal contours are within normal limits. Mild hyperinflation of the lungs, upper lobe predominant with slight crowding of lower lobe interstitial lung  markings. No pneumonic consolidation, CHF, effusion or pneumothorax. The visualized skeletal structures are unremarkable. IMPRESSION: Mild bilateral upper lobe emphysematous hyperinflation with crowding of lower lobe interstitial lung markings. Electronically Signed   By: Ashley Royalty M.D.   On: 09/16/2016 23:43    Micro Results    Recent Results (from the past 240 hour(s))  Urine culture     Status: Abnormal   Collection Time: 09/17/16 12:49 AM  Result Value Ref Range Status   Specimen Description URINE, RANDOM  Final   Special Requests NONE  Final   Culture MULTIPLE SPECIES PRESENT, SUGGEST RECOLLECTION (A)  Final   Report Status 09/18/2016 FINAL  Final  MRSA PCR Screening     Status: None   Collection Time: 09/17/16  8:53 AM  Result Value Ref Range Status   MRSA by PCR NEGATIVE NEGATIVE Final    Comment:        The GeneXpert MRSA Assay (FDA approved for NASAL specimens only), is one component of a comprehensive MRSA colonization surveillance program. It is not intended to diagnose MRSA infection nor to guide or monitor treatment for MRSA infections.     Today   Subjective    [name] today has no headache,no chest abdominal pain,no new weakness tingling or numbness, feels much better wants to go home today.     Objective   Blood pressure (!) 152/85, pulse 87, temperature 97.9 F (36.6 C), temperature source Oral, resp. rate 16, height 5' (1.524 m), weight 102.2 kg (225 lb 3.2 oz), SpO2 99 %.   Intake/Output Summary (Last 24 hours) at 09/18/16 0900 Last data filed at 09/18/16 0448  Gross per 24 hour  Intake           1297.5 ml  Output             1650 ml  Net           -352.5 ml    Exam Awake Alert, Oriented x  3, No new F.N deficits, Normal affect [place].AT,PERRAL Supple Neck,No JVD, No cervical lymphadenopathy appriciated.  Symmetrical Chest wall movement, Good air movement bilaterally, CTAB RRR,No Gallops,Rubs or new Murmurs, No Parasternal Heave +ve  B.Sounds, Abd Soft, Non tender, No organomegaly appriciated, No rebound -guarding or rigidity. No Cyanosis, Clubbing or edema, No new Rash or bruise, R facial post op scar site stable   Data Review   CBC w Diff: Lab Results  Component Value Date   WBC 4.4 09/18/2016   HGB 11.6 (L) 09/18/2016   HCT 36.0 09/18/2016   PLT 189 09/18/2016   LYMPHOPCT 15 09/17/2016   MONOPCT 6 09/17/2016   EOSPCT 0 09/17/2016   BASOPCT 0 09/17/2016    CMP: Lab Results  Component Value Date   NA 140 09/18/2016   K 4.2 09/18/2016   CL 109 09/18/2016   CO2 24 09/18/2016   BUN 10 09/18/2016   CREATININE 0.83 09/18/2016   PROT 5.2 (L) 09/17/2016   ALBUMIN 2.7 (L) 09/17/2016   BILITOT 0.3 09/17/2016   ALKPHOS 39 09/17/2016   AST 24 09/17/2016   ALT 13 (L) 09/17/2016  .   Total Time in preparing paper work, data evaluation and todays exam - 56 minutes  Lala Lund M.D on 09/18/2016 at 9:00 AM  [facility]   Office  [phone]

## 2016-09-18 NOTE — Consult Note (Signed)
[facility] CM Primary Care Navigator  09/18/2016  [name] 1945-06-13 [id number]   Met with patient and son in-law [name]) at the bedside to identify possible discharge needs. Patient reports having fever/ chills and dry mouth that led to this admission.  Patient endorses Dr. Donnie Coffin with [facility] as the primary care provider.   Patient shared using [facility] to obtain medications without difficulty.   Patient reports managingher own medications at home with use of "pill box" system filled once a month.  Patientstates that her brother [name]), son in-law and her friend Arville Go) provides  transportation when she needs it, as well as to her doctors' appointments.  Patient reports that daughter [name]) lives with her and will be her primary caregiver at home. Her other daughters [name] and [name]) will also be able to provide assistance with care when needed.  Discharge plan is home according to patient.  Patientand son in-law expressedunderstanding to call primary care provider's officewhen she returns back home for a post discharge follow-up appointment within a week or sooner if needed.Patient letter (with PCP's contact number) was provided as areminder.  Explained to patient about [facility] CM services available for health management but patient states thatit has been managed at home with oral medication and diet. Primary care provider had been monitoring A1C every 6 months and helps in managing DM as stated.  Patient expressed understanding to request a referral to [facility] care management from primary care provider if deemed necessary/ appropriate for services in the future. [facility] care management contact provided for future needs that may arise.   For questions, please contact:  Dannielle Huh, BSN, RN- [facility] Primary Care Navigator  Telephone: [phone] [place]

## 2016-09-18 NOTE — Discharge Instructions (Signed)
Antibiotic Medicine, Adult Antibiotic medicines treat infections caused by a type of germ called bacteria. They work by killing the bacteria that make you sick. When do I need to take antibiotics? You often need these medicines to treat bacterial infections, such as:  A urinary tract infection (UTI).  Strep throat.  Meningitis. This affects the spinal cord and brain.  A bad lung infection.  You may start the medicines while your doctor waits for tests to come back. When the tests come back, your doctor may change or stop your medicine. When are antibiotics not needed? You do not need these medicines for most common illnesses, such as:  A cold.  The flu.  A sore throat.  Antibiotics are not always needed for all infections caused by bacteria. Do not ask for these medicines, or take them, when they are not needed. What are the risks of taking antibiotics? Most antibiotics can cause an infection called Clostridium difficile.This causes watery poop (diarrhea). Let your doctor know right away if:  You have watery poop while taking an antibiotic.  You have watery poop after you stop taking an antibiotic. The illness can happen weeks after you stop the medicine.  You also have a risk of getting an infection in the future that antibiotics cannot treat (antibiotic-resistant infection). This type of infection can be dangerous. What else should I know about taking antibiotics?  You need to take the entire prescription. ? Take the medicine for as long as told by your doctor. ? Do not stop taking it even if you start to feel better.  Try not to miss any doses. If you miss a dose, call your doctor.  Birth control pills may not work. If you take birth control pills: ? Keep on taking them. ? Use a second form of birth control, such as a condom. Do this for as long as told by your doctor.  Ask your doctor: ? How long to wait in between doses. ? If you should take the medicine with  food. ? If there is anything you should stay away from while taking the antibiotic, such as: ? Food. ? Drinks. ? Medicines. ? If there are any side effects you should watch for.  Only take the medicines that your doctor told you to take. Do not take medicines that were given to someone else.  Drink a large glass of water with the medicine.  Ask the pharmacist for a tool to measure the medicine, such as: ? A syringe. ? A cup. ? A spoon.  Throw away any extra medicine. Contact a doctor if:  You get worse.  You have new joint pain or muscle aches after starting the medicine.  You have side effects from the medicine, such as: ? Stomach pain. ? Watery poop. ? Feeling sick to your stomach (nausea). Get help right away if:  You have signs of a very bad allergic reaction. If this happens, stop taking the medicine right away. Signs may include: ? Hives. These are raised, itchy, red bumps on the skin. ? Skin rash. ? Trouble breathing. ? Wheezing. ? Swelling. ? Feeling dizzy. ? Throwing up (vomiting).  Your pee (urine) is dark, or is the color of blood.  Your skin turns yellow.  You bruise easily.  You bleed easily.  You have very bad watery poop and cramps in your belly.  You have a very bad headache. Summary  Antibiotics are often used to treat infections caused by bacteria.  Only take  these medicines when needed.  Let your doctor know if you have watery poop while taking an antibiotic.  You need to take the entire prescription. This information is not intended to replace advice given to you by your health care provider. Make sure you discuss any questions you have with your health care provider. Document Released: 01/14/2008 Document Revised: 04/08/2016 Document Reviewed: 04/08/2016 Elsevier Interactive Patient Education  2017 [place]. Follow with Primary MD Alroy Dust, L.Marlou Sa, MD in 7 days   Get CBC, CMP, 2 view Chest X ray checked  by Primary MD or SNF MD  in 5-7 days ( we routinely change or add medications that can affect your baseline labs and fluid status, therefore we recommend that you get the mentioned basic workup next visit with your PCP, your PCP may decide not to get them or add new tests based on their clinical decision)  Activity: As tolerated with Full fall precautions use walker/cane & assistance as needed  Disposition Home    Diet:   Diet heart healthy/carb modified .  For Heart failure patients - Check your Weight same time everyday, if you gain over 2 pounds, or you develop in leg swelling, experience more shortness of breath or chest pain, call your Primary MD immediately. Follow Cardiac Low Salt Diet and 1.5 lit/day fluid restriction.  On your next visit with your primary care physician please Get Medicines reviewed and adjusted.  Please request your Prim.MD to go over all Hospital Tests and Procedure/Radiological results at the follow up, please get all Hospital records sent to your Prim MD by signing hospital release before you go home.  If you experience worsening of your admission symptoms, develop shortness of breath, life threatening emergency, suicidal or homicidal thoughts you must seek medical attention immediately by calling 911 or calling your MD immediately  if symptoms less severe.  You Must read complete instructions/literature along with all the possible adverse reactions/side effects for all the Medicines you take and that have been prescribed to you. Take any new Medicines after you have completely understood and accpet all the possible adverse reactions/side effects.   Do not drive, operate heavy machinery, perform activities at heights, swimming or participation in water activities or provide baby sitting services if your were admitted for syncope or siezures until you have seen by Primary MD or a Neurologist and advised to do so again.  Do not drive when taking Pain medications.    Do not take more than  prescribed Pain, Sleep and Anxiety Medications  Special Instructions: If you have smoked or chewed Tobacco  in the last 2 yrs please stop smoking, stop any regular Alcohol  and or any Recreational drug use.  Wear Seat belts while driving.   Please note  You were cared for by a hospitalist during your hospital stay. If you have any questions about your discharge medications or the care you received while you were in the hospital after you are discharged, you can call the unit and asked to speak with the hospitalist on call if the hospitalist that took care of you is not available. Once you are discharged, your primary care physician will handle any further medical issues. Please note that NO REFILLS for any discharge medications will be authorized once you are discharged, as it is imperative that you return to your primary care physician (or establish a relationship with a primary care physician if you do not have one) for your aftercare needs so that they can reassess your  need for medications and monitor your lab values.

## 2016-09-22 LAB — CULTURE, BLOOD (ROUTINE X 2)
CULTURE: NO GROWTH
Culture: NO GROWTH
SPECIAL REQUESTS: ADEQUATE
Special Requests: ADEQUATE

## 2016-09-24 ENCOUNTER — Other Ambulatory Visit: Payer: Self-pay | Admitting: Family Medicine

## 2016-09-24 ENCOUNTER — Ambulatory Visit
Admission: RE | Admit: 2016-09-24 | Discharge: 2016-09-24 | Disposition: A | Discharge status: Home / Self Care | Payer: Medicare Other | Source: Ambulatory Visit | Attending: Family Medicine | Admitting: Family Medicine

## 2016-09-24 DIAGNOSIS — A419 Sepsis, unspecified organism: Secondary | ICD-10-CM | POA: Diagnosis not present

## 2016-09-24 DIAGNOSIS — E7800 Pure hypercholesterolemia, unspecified: Secondary | ICD-10-CM | POA: Diagnosis not present

## 2016-09-24 DIAGNOSIS — F320 Major depressive disorder, single episode, mild: Secondary | ICD-10-CM | POA: Diagnosis not present

## 2016-09-24 DIAGNOSIS — R110 Nausea: Secondary | ICD-10-CM | POA: Diagnosis not present

## 2016-09-24 DIAGNOSIS — E119 Type 2 diabetes mellitus without complications: Secondary | ICD-10-CM | POA: Diagnosis not present

## 2016-09-24 DIAGNOSIS — I10 Essential (primary) hypertension: Secondary | ICD-10-CM | POA: Diagnosis not present

## 2016-09-24 DIAGNOSIS — Z7984 Long term (current) use of oral hypoglycemic drugs: Secondary | ICD-10-CM | POA: Diagnosis not present

## 2016-10-28 DIAGNOSIS — Z7984 Long term (current) use of oral hypoglycemic drugs: Secondary | ICD-10-CM | POA: Diagnosis not present

## 2016-10-28 DIAGNOSIS — E119 Type 2 diabetes mellitus without complications: Secondary | ICD-10-CM | POA: Diagnosis not present

## 2016-12-08 DIAGNOSIS — H353134 Nonexudative age-related macular degeneration, bilateral, advanced atrophic with subfoveal involvement: Secondary | ICD-10-CM | POA: Diagnosis not present

## 2016-12-08 DIAGNOSIS — Z961 Presence of intraocular lens: Secondary | ICD-10-CM | POA: Diagnosis not present

## 2016-12-09 DIAGNOSIS — H401134 Primary open-angle glaucoma, bilateral, indeterminate stage: Secondary | ICD-10-CM | POA: Diagnosis not present

## 2016-12-23 DIAGNOSIS — K5730 Diverticulosis of large intestine without perforation or abscess without bleeding: Secondary | ICD-10-CM | POA: Diagnosis not present

## 2016-12-23 DIAGNOSIS — Z86010 Personal history of colonic polyps: Secondary | ICD-10-CM | POA: Diagnosis not present

## 2017-05-11 DIAGNOSIS — M150 Primary generalized (osteo)arthritis: Secondary | ICD-10-CM | POA: Diagnosis not present

## 2017-05-11 DIAGNOSIS — I10 Essential (primary) hypertension: Secondary | ICD-10-CM | POA: Diagnosis not present

## 2017-05-11 DIAGNOSIS — Z7984 Long term (current) use of oral hypoglycemic drugs: Secondary | ICD-10-CM | POA: Diagnosis not present

## 2017-05-11 DIAGNOSIS — E119 Type 2 diabetes mellitus without complications: Secondary | ICD-10-CM | POA: Diagnosis not present

## 2017-05-11 DIAGNOSIS — Z23 Encounter for immunization: Secondary | ICD-10-CM | POA: Diagnosis not present

## 2017-05-11 DIAGNOSIS — F320 Major depressive disorder, single episode, mild: Secondary | ICD-10-CM | POA: Diagnosis not present

## 2017-05-11 DIAGNOSIS — E7800 Pure hypercholesterolemia, unspecified: Secondary | ICD-10-CM | POA: Diagnosis not present

## 2017-07-14 DIAGNOSIS — M79672 Pain in left foot: Secondary | ICD-10-CM | POA: Diagnosis not present

## 2017-07-16 DIAGNOSIS — L718 Other rosacea: Secondary | ICD-10-CM | POA: Diagnosis not present

## 2017-08-09 DIAGNOSIS — M79672 Pain in left foot: Secondary | ICD-10-CM | POA: Diagnosis not present

## 2017-08-18 DIAGNOSIS — F320 Major depressive disorder, single episode, mild: Secondary | ICD-10-CM | POA: Diagnosis not present

## 2017-08-18 DIAGNOSIS — R251 Tremor, unspecified: Secondary | ICD-10-CM | POA: Diagnosis not present

## 2017-08-18 DIAGNOSIS — R110 Nausea: Secondary | ICD-10-CM | POA: Diagnosis not present

## 2017-08-18 DIAGNOSIS — E119 Type 2 diabetes mellitus without complications: Secondary | ICD-10-CM | POA: Diagnosis not present

## 2017-09-07 DIAGNOSIS — H31011 Macula scars of posterior pole (postinflammatory) (post-traumatic), right eye: Secondary | ICD-10-CM | POA: Diagnosis not present

## 2017-09-07 DIAGNOSIS — Z961 Presence of intraocular lens: Secondary | ICD-10-CM | POA: Diagnosis not present

## 2017-09-07 DIAGNOSIS — H353232 Exudative age-related macular degeneration, bilateral, with inactive choroidal neovascularization: Secondary | ICD-10-CM | POA: Diagnosis not present

## 2017-09-07 DIAGNOSIS — E119 Type 2 diabetes mellitus without complications: Secondary | ICD-10-CM | POA: Diagnosis not present

## 2017-09-07 DIAGNOSIS — Z7984 Long term (current) use of oral hypoglycemic drugs: Secondary | ICD-10-CM | POA: Diagnosis not present

## 2017-09-15 DIAGNOSIS — L718 Other rosacea: Secondary | ICD-10-CM | POA: Diagnosis not present

## 2017-11-08 DIAGNOSIS — Z736 Limitation of activities due to disability: Secondary | ICD-10-CM | POA: Diagnosis not present

## 2017-11-08 DIAGNOSIS — H53413 Scotoma involving central area, bilateral: Secondary | ICD-10-CM | POA: Diagnosis not present

## 2017-11-08 DIAGNOSIS — H541131 Blindness right eye category 3, low vision left eye category 1: Secondary | ICD-10-CM | POA: Diagnosis not present

## 2017-11-08 DIAGNOSIS — H539 Unspecified visual disturbance: Secondary | ICD-10-CM | POA: Diagnosis not present

## 2017-11-08 DIAGNOSIS — H3530 Unspecified macular degeneration: Secondary | ICD-10-CM | POA: Diagnosis not present

## 2017-11-29 DIAGNOSIS — Z1159 Encounter for screening for other viral diseases: Secondary | ICD-10-CM | POA: Diagnosis not present

## 2017-11-29 DIAGNOSIS — I10 Essential (primary) hypertension: Secondary | ICD-10-CM | POA: Diagnosis not present

## 2017-11-29 DIAGNOSIS — E7800 Pure hypercholesterolemia, unspecified: Secondary | ICD-10-CM | POA: Diagnosis not present

## 2017-11-29 DIAGNOSIS — F320 Major depressive disorder, single episode, mild: Secondary | ICD-10-CM | POA: Diagnosis not present

## 2017-11-29 DIAGNOSIS — Z23 Encounter for immunization: Secondary | ICD-10-CM | POA: Diagnosis not present

## 2017-11-29 DIAGNOSIS — E119 Type 2 diabetes mellitus without complications: Secondary | ICD-10-CM | POA: Diagnosis not present

## 2017-11-29 DIAGNOSIS — M150 Primary generalized (osteo)arthritis: Secondary | ICD-10-CM | POA: Diagnosis not present

## 2018-01-19 DIAGNOSIS — Z23 Encounter for immunization: Secondary | ICD-10-CM | POA: Diagnosis not present

## 2018-03-04 DIAGNOSIS — R5383 Other fatigue: Secondary | ICD-10-CM | POA: Diagnosis not present

## 2018-05-10 DIAGNOSIS — E119 Type 2 diabetes mellitus without complications: Secondary | ICD-10-CM | POA: Diagnosis not present

## 2018-05-10 DIAGNOSIS — Z961 Presence of intraocular lens: Secondary | ICD-10-CM | POA: Diagnosis not present

## 2018-05-10 DIAGNOSIS — H353134 Nonexudative age-related macular degeneration, bilateral, advanced atrophic with subfoveal involvement: Secondary | ICD-10-CM | POA: Diagnosis not present

## 2018-05-10 DIAGNOSIS — H35341 Macular cyst, hole, or pseudohole, right eye: Secondary | ICD-10-CM | POA: Diagnosis not present

## 2018-05-11 IMAGING — CR DG CHEST 2V
2 series · 2 of 2 positions shown · non-contrast
Comparison: Chest x-ray of September 16, 2016

CLINICAL DATA: Sepsis by history. No current chest complaints.
History of hypertension, former smoker, diabetes.

EXAM:
CHEST  2 VIEW

[w chest pa]
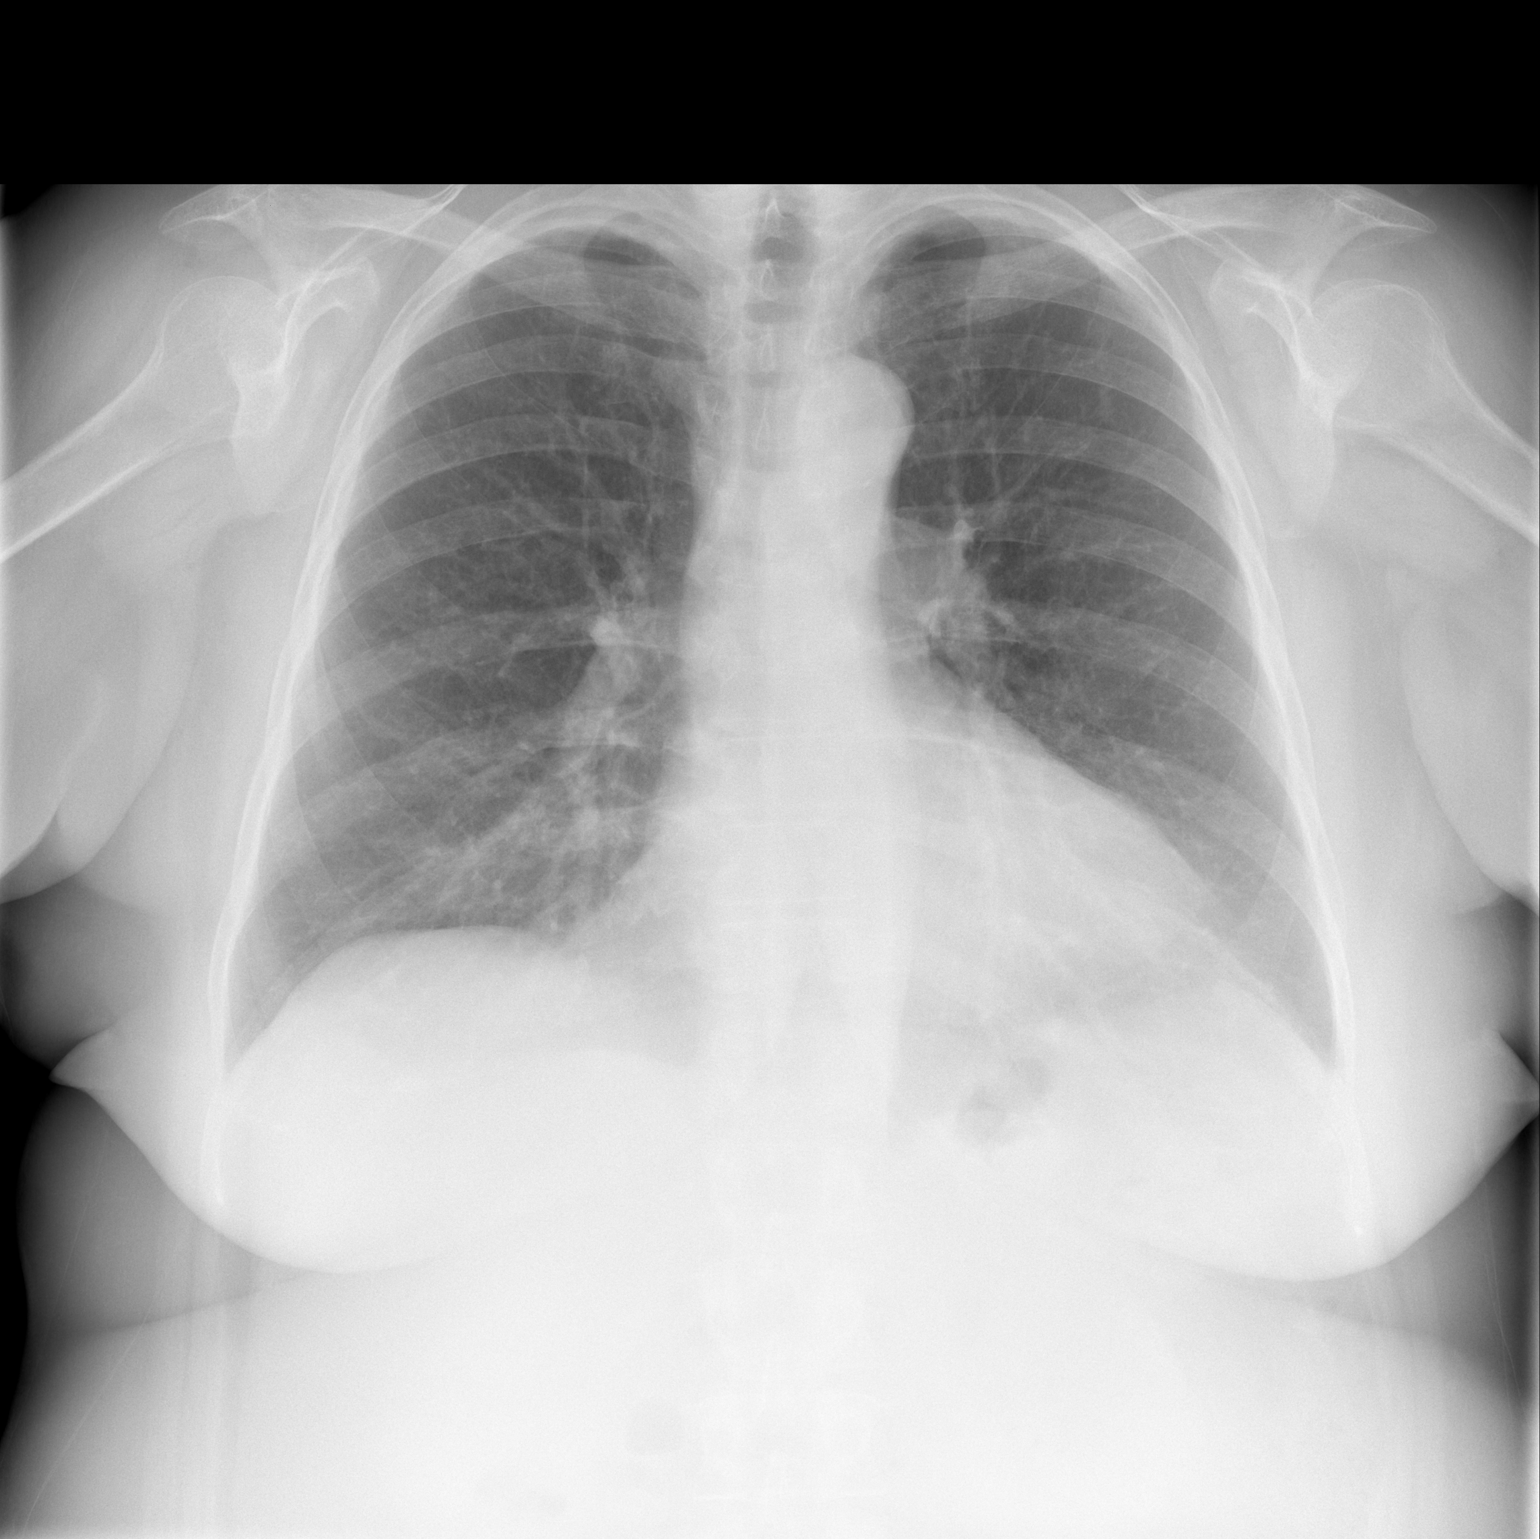

[w chest lat]
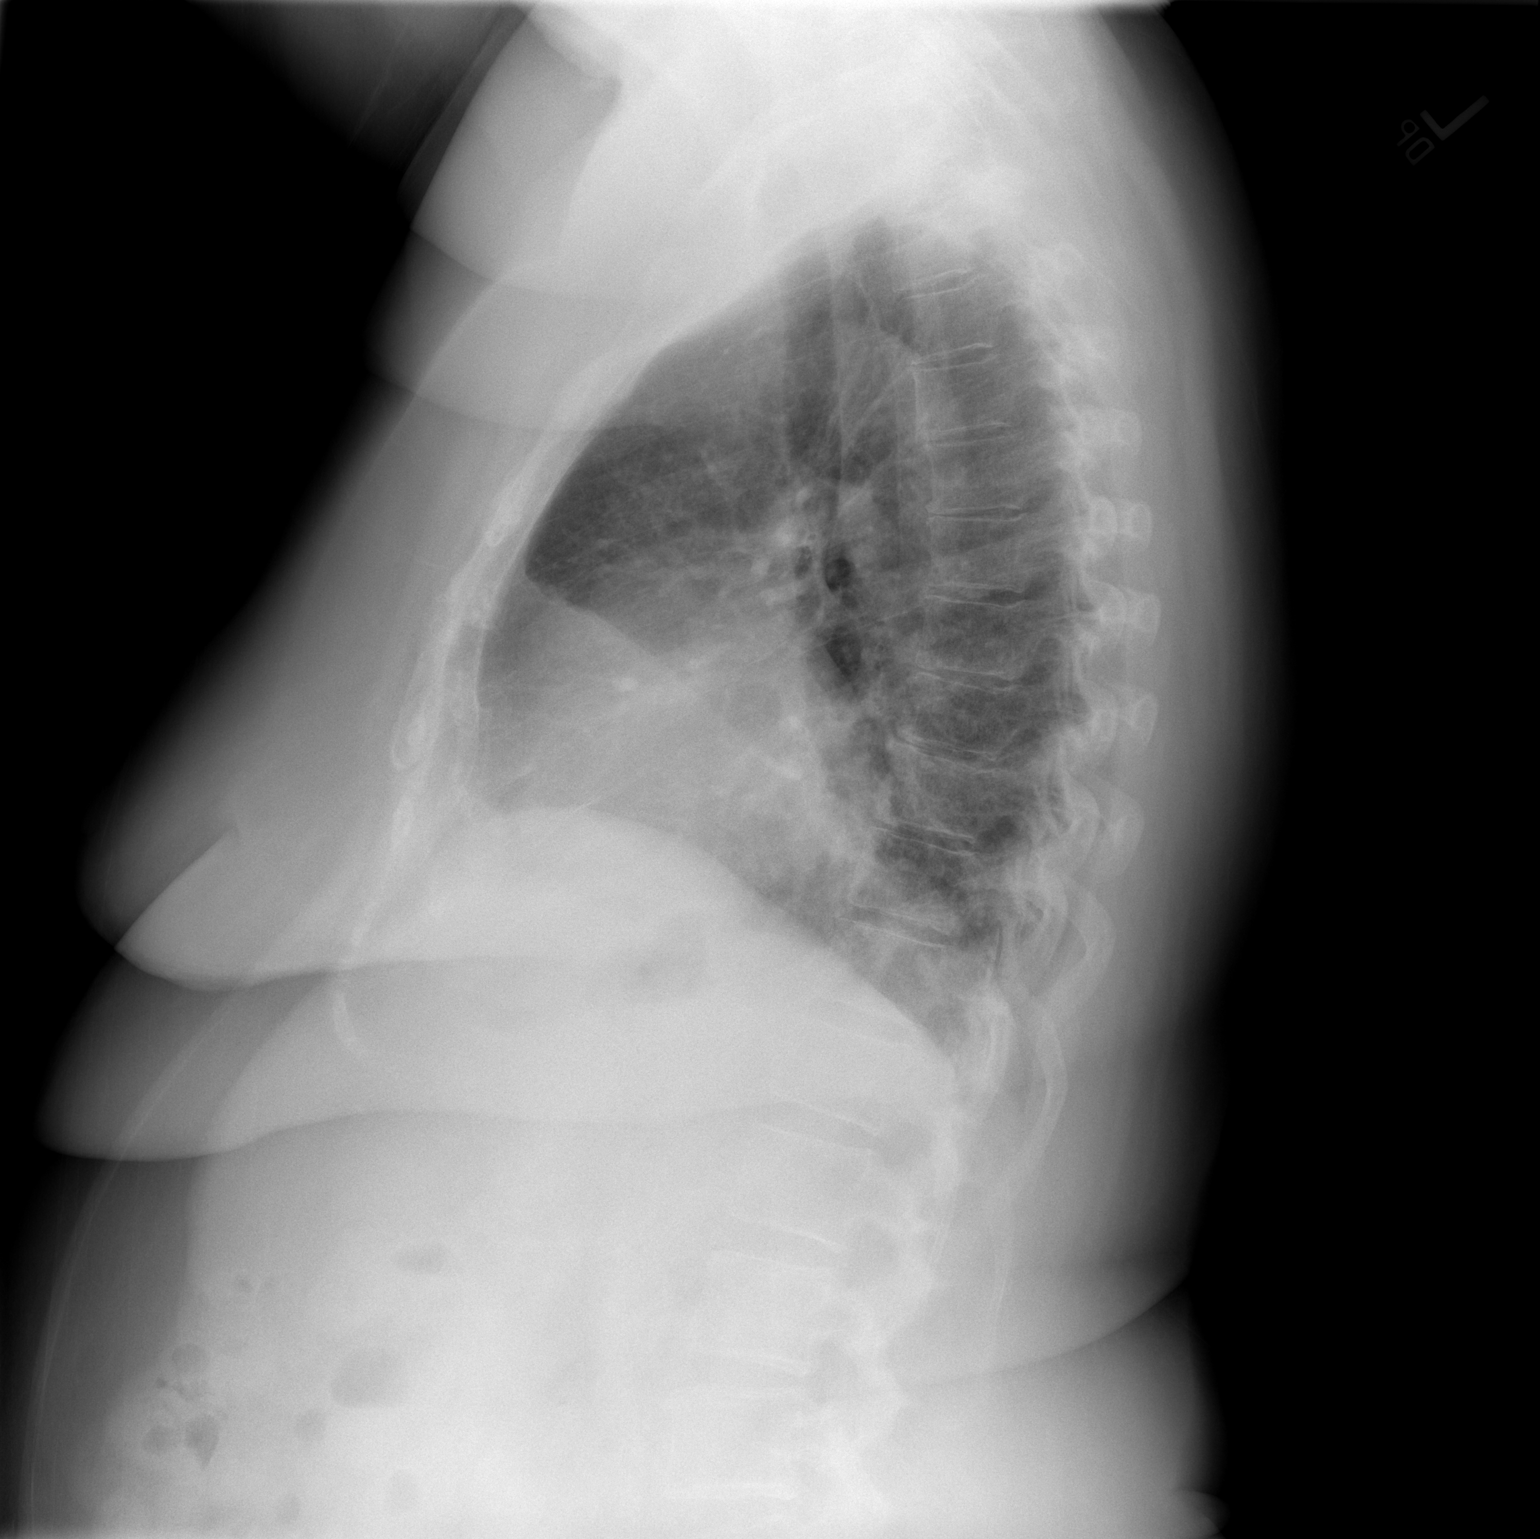

[2 of 2 positions shown; findings below may reference images not displayed]

FINDINGS: The lungs are adequately inflated. There is no focal infiltrate.
There is no pleural effusion. The heart and pulmonary vascularity
are normal. The mediastinum is normal in width. There is
calcification in the wall of the aortic arch. The bony thorax
exhibits no acute abnormality.
IMPRESSION: There is no pneumonia nor other acute cardiopulmonary abnormality.

## 2018-05-19 DIAGNOSIS — R200 Anesthesia of skin: Secondary | ICD-10-CM | POA: Diagnosis not present

## 2018-05-19 DIAGNOSIS — M79642 Pain in left hand: Secondary | ICD-10-CM | POA: Diagnosis not present

## 2018-05-25 DIAGNOSIS — G5602 Carpal tunnel syndrome, left upper limb: Secondary | ICD-10-CM | POA: Diagnosis not present

## 2018-06-07 DIAGNOSIS — M654 Radial styloid tenosynovitis [de Quervain]: Secondary | ICD-10-CM | POA: Diagnosis not present

## 2018-06-17 DIAGNOSIS — H53413 Scotoma involving central area, bilateral: Secondary | ICD-10-CM | POA: Diagnosis not present

## 2018-06-17 DIAGNOSIS — H541131 Blindness right eye category 3, low vision left eye category 1: Secondary | ICD-10-CM | POA: Diagnosis not present

## 2018-06-24 DIAGNOSIS — H53413 Scotoma involving central area, bilateral: Secondary | ICD-10-CM | POA: Diagnosis not present

## 2018-06-24 DIAGNOSIS — H541131 Blindness right eye category 3, low vision left eye category 1: Secondary | ICD-10-CM | POA: Diagnosis not present

## 2018-06-29 DIAGNOSIS — H53413 Scotoma involving central area, bilateral: Secondary | ICD-10-CM | POA: Diagnosis not present

## 2018-06-29 DIAGNOSIS — H541131 Blindness right eye category 3, low vision left eye category 1: Secondary | ICD-10-CM | POA: Diagnosis not present

## 2018-07-05 DIAGNOSIS — H541131 Blindness right eye category 3, low vision left eye category 1: Secondary | ICD-10-CM | POA: Diagnosis not present

## 2018-07-05 DIAGNOSIS — H53413 Scotoma involving central area, bilateral: Secondary | ICD-10-CM | POA: Diagnosis not present

## 2018-07-06 DIAGNOSIS — F320 Major depressive disorder, single episode, mild: Secondary | ICD-10-CM | POA: Diagnosis not present

## 2018-07-06 DIAGNOSIS — E7800 Pure hypercholesterolemia, unspecified: Secondary | ICD-10-CM | POA: Diagnosis not present

## 2018-07-06 DIAGNOSIS — E1169 Type 2 diabetes mellitus with other specified complication: Secondary | ICD-10-CM | POA: Diagnosis not present

## 2018-07-06 DIAGNOSIS — R609 Edema, unspecified: Secondary | ICD-10-CM | POA: Diagnosis not present

## 2018-07-06 DIAGNOSIS — M150 Primary generalized (osteo)arthritis: Secondary | ICD-10-CM | POA: Diagnosis not present

## 2018-07-06 DIAGNOSIS — I10 Essential (primary) hypertension: Secondary | ICD-10-CM | POA: Diagnosis not present

## 2018-07-12 DIAGNOSIS — E119 Type 2 diabetes mellitus without complications: Secondary | ICD-10-CM | POA: Diagnosis not present

## 2018-07-13 DIAGNOSIS — H53413 Scotoma involving central area, bilateral: Secondary | ICD-10-CM | POA: Diagnosis not present

## 2018-07-13 DIAGNOSIS — H541131 Blindness right eye category 3, low vision left eye category 1: Secondary | ICD-10-CM | POA: Diagnosis not present

## 2018-08-03 DIAGNOSIS — H541131 Blindness right eye category 3, low vision left eye category 1: Secondary | ICD-10-CM | POA: Diagnosis not present

## 2018-08-03 DIAGNOSIS — H53413 Scotoma involving central area, bilateral: Secondary | ICD-10-CM | POA: Diagnosis not present

## 2018-08-08 DIAGNOSIS — E119 Type 2 diabetes mellitus without complications: Secondary | ICD-10-CM | POA: Diagnosis not present

## 2018-08-11 DIAGNOSIS — H541131 Blindness right eye category 3, low vision left eye category 1: Secondary | ICD-10-CM | POA: Diagnosis not present

## 2018-08-11 DIAGNOSIS — H53413 Scotoma involving central area, bilateral: Secondary | ICD-10-CM | POA: Diagnosis not present

## 2018-08-15 DIAGNOSIS — E119 Type 2 diabetes mellitus without complications: Secondary | ICD-10-CM | POA: Diagnosis not present

## 2018-08-18 DIAGNOSIS — H541131 Blindness right eye category 3, low vision left eye category 1: Secondary | ICD-10-CM | POA: Diagnosis not present

## 2018-08-18 DIAGNOSIS — H53413 Scotoma involving central area, bilateral: Secondary | ICD-10-CM | POA: Diagnosis not present

## 2018-08-25 DIAGNOSIS — H541131 Blindness right eye category 3, low vision left eye category 1: Secondary | ICD-10-CM | POA: Diagnosis not present

## 2018-08-25 DIAGNOSIS — H53413 Scotoma involving central area, bilateral: Secondary | ICD-10-CM | POA: Diagnosis not present

## 2018-08-29 DIAGNOSIS — E119 Type 2 diabetes mellitus without complications: Secondary | ICD-10-CM | POA: Diagnosis not present

## 2018-09-01 DIAGNOSIS — H541131 Blindness right eye category 3, low vision left eye category 1: Secondary | ICD-10-CM | POA: Diagnosis not present

## 2018-09-01 DIAGNOSIS — H53413 Scotoma involving central area, bilateral: Secondary | ICD-10-CM | POA: Diagnosis not present

## 2018-09-08 DIAGNOSIS — H53413 Scotoma involving central area, bilateral: Secondary | ICD-10-CM | POA: Diagnosis not present

## 2018-09-08 DIAGNOSIS — H541131 Blindness right eye category 3, low vision left eye category 1: Secondary | ICD-10-CM | POA: Diagnosis not present

## 2018-09-15 DIAGNOSIS — M654 Radial styloid tenosynovitis [de Quervain]: Secondary | ICD-10-CM | POA: Diagnosis not present

## 2018-09-15 DIAGNOSIS — G5602 Carpal tunnel syndrome, left upper limb: Secondary | ICD-10-CM | POA: Diagnosis not present

## 2018-09-16 DIAGNOSIS — H53413 Scotoma involving central area, bilateral: Secondary | ICD-10-CM | POA: Diagnosis not present

## 2018-09-16 DIAGNOSIS — H541131 Blindness right eye category 3, low vision left eye category 1: Secondary | ICD-10-CM | POA: Diagnosis not present

## 2018-09-22 DIAGNOSIS — H541131 Blindness right eye category 3, low vision left eye category 1: Secondary | ICD-10-CM | POA: Diagnosis not present

## 2018-09-22 DIAGNOSIS — H53413 Scotoma involving central area, bilateral: Secondary | ICD-10-CM | POA: Diagnosis not present

## 2018-09-26 DIAGNOSIS — E119 Type 2 diabetes mellitus without complications: Secondary | ICD-10-CM | POA: Diagnosis not present

## 2018-10-13 DIAGNOSIS — H53413 Scotoma involving central area, bilateral: Secondary | ICD-10-CM | POA: Diagnosis not present

## 2018-10-13 DIAGNOSIS — H541131 Blindness right eye category 3, low vision left eye category 1: Secondary | ICD-10-CM | POA: Diagnosis not present

## 2018-10-18 DIAGNOSIS — H35341 Macular cyst, hole, or pseudohole, right eye: Secondary | ICD-10-CM | POA: Diagnosis not present

## 2018-10-18 DIAGNOSIS — E119 Type 2 diabetes mellitus without complications: Secondary | ICD-10-CM | POA: Diagnosis not present

## 2018-10-18 DIAGNOSIS — H353134 Nonexudative age-related macular degeneration, bilateral, advanced atrophic with subfoveal involvement: Secondary | ICD-10-CM | POA: Diagnosis not present

## 2018-10-18 DIAGNOSIS — Z961 Presence of intraocular lens: Secondary | ICD-10-CM | POA: Diagnosis not present

## 2018-10-19 DIAGNOSIS — H541131 Blindness right eye category 3, low vision left eye category 1: Secondary | ICD-10-CM | POA: Diagnosis not present

## 2018-10-19 DIAGNOSIS — H53413 Scotoma involving central area, bilateral: Secondary | ICD-10-CM | POA: Diagnosis not present

## 2018-11-01 DIAGNOSIS — H53413 Scotoma involving central area, bilateral: Secondary | ICD-10-CM | POA: Diagnosis not present

## 2018-11-01 DIAGNOSIS — H541131 Blindness right eye category 3, low vision left eye category 1: Secondary | ICD-10-CM | POA: Diagnosis not present

## 2018-11-08 DIAGNOSIS — H53413 Scotoma involving central area, bilateral: Secondary | ICD-10-CM | POA: Diagnosis not present

## 2018-11-08 DIAGNOSIS — H541131 Blindness right eye category 3, low vision left eye category 1: Secondary | ICD-10-CM | POA: Diagnosis not present

## 2018-11-17 DIAGNOSIS — G5602 Carpal tunnel syndrome, left upper limb: Secondary | ICD-10-CM | POA: Diagnosis not present

## 2018-11-17 DIAGNOSIS — M654 Radial styloid tenosynovitis [de Quervain]: Secondary | ICD-10-CM | POA: Diagnosis not present

## 2018-11-23 DIAGNOSIS — H541131 Blindness right eye category 3, low vision left eye category 1: Secondary | ICD-10-CM | POA: Diagnosis not present

## 2018-11-23 DIAGNOSIS — H53413 Scotoma involving central area, bilateral: Secondary | ICD-10-CM | POA: Diagnosis not present

## 2018-11-29 DIAGNOSIS — E119 Type 2 diabetes mellitus without complications: Secondary | ICD-10-CM | POA: Diagnosis not present

## 2018-12-13 DIAGNOSIS — M654 Radial styloid tenosynovitis [de Quervain]: Secondary | ICD-10-CM | POA: Diagnosis not present

## 2018-12-13 DIAGNOSIS — G5602 Carpal tunnel syndrome, left upper limb: Secondary | ICD-10-CM | POA: Diagnosis not present

## 2018-12-14 ENCOUNTER — Other Ambulatory Visit: Payer: Self-pay | Admitting: Orthopedic Surgery

## 2018-12-14 DIAGNOSIS — M654 Radial styloid tenosynovitis [de Quervain]: Secondary | ICD-10-CM

## 2018-12-14 DIAGNOSIS — G5602 Carpal tunnel syndrome, left upper limb: Secondary | ICD-10-CM

## 2018-12-23 ENCOUNTER — Other Ambulatory Visit: Payer: Self-pay

## 2018-12-23 ENCOUNTER — Ambulatory Visit
Admission: RE | Admit: 2018-12-23 | Discharge: 2018-12-23 | Disposition: A | Discharge status: Home / Self Care | Payer: Medicare Other | Source: Ambulatory Visit | Attending: Orthopedic Surgery | Admitting: Orthopedic Surgery

## 2018-12-23 DIAGNOSIS — M654 Radial styloid tenosynovitis [de Quervain]: Secondary | ICD-10-CM | POA: Diagnosis not present

## 2018-12-23 DIAGNOSIS — M25532 Pain in left wrist: Secondary | ICD-10-CM | POA: Diagnosis not present

## 2018-12-23 DIAGNOSIS — G5602 Carpal tunnel syndrome, left upper limb: Secondary | ICD-10-CM

## 2018-12-23 DIAGNOSIS — M19032 Primary osteoarthritis, left wrist: Secondary | ICD-10-CM | POA: Diagnosis not present

## 2018-12-23 MED ORDER — IOPAMIDOL (ISOVUE-M 200) INJECTION 41%
1.5000 mL | Freq: Once | INTRAMUSCULAR | Status: AC
  Administered 2018-12-23: 1.5 mL via INTRA_ARTICULAR

## 2019-01-02 DIAGNOSIS — M654 Radial styloid tenosynovitis [de Quervain]: Secondary | ICD-10-CM | POA: Diagnosis not present

## 2019-01-02 DIAGNOSIS — G5602 Carpal tunnel syndrome, left upper limb: Secondary | ICD-10-CM | POA: Diagnosis not present

## 2019-01-03 DIAGNOSIS — H02834 Dermatochalasis of left upper eyelid: Secondary | ICD-10-CM | POA: Diagnosis not present

## 2019-01-03 DIAGNOSIS — H02831 Dermatochalasis of right upper eyelid: Secondary | ICD-10-CM | POA: Diagnosis not present

## 2019-01-10 DIAGNOSIS — E1169 Type 2 diabetes mellitus with other specified complication: Secondary | ICD-10-CM | POA: Diagnosis not present

## 2019-01-10 DIAGNOSIS — M8580 Other specified disorders of bone density and structure, unspecified site: Secondary | ICD-10-CM | POA: Diagnosis not present

## 2019-01-10 DIAGNOSIS — E7800 Pure hypercholesterolemia, unspecified: Secondary | ICD-10-CM | POA: Diagnosis not present

## 2019-01-10 DIAGNOSIS — Z0000 Encounter for general adult medical examination without abnormal findings: Secondary | ICD-10-CM | POA: Diagnosis not present

## 2019-01-10 DIAGNOSIS — F320 Major depressive disorder, single episode, mild: Secondary | ICD-10-CM | POA: Diagnosis not present

## 2019-01-10 DIAGNOSIS — I10 Essential (primary) hypertension: Secondary | ICD-10-CM | POA: Diagnosis not present

## 2019-01-10 DIAGNOSIS — G4700 Insomnia, unspecified: Secondary | ICD-10-CM | POA: Diagnosis not present

## 2019-01-10 DIAGNOSIS — M150 Primary generalized (osteo)arthritis: Secondary | ICD-10-CM | POA: Diagnosis not present

## 2019-01-13 DIAGNOSIS — E1169 Type 2 diabetes mellitus with other specified complication: Secondary | ICD-10-CM | POA: Diagnosis not present

## 2019-01-13 DIAGNOSIS — Z23 Encounter for immunization: Secondary | ICD-10-CM | POA: Diagnosis not present

## 2019-01-19 DIAGNOSIS — M654 Radial styloid tenosynovitis [de Quervain]: Secondary | ICD-10-CM | POA: Diagnosis not present

## 2019-02-23 ENCOUNTER — Inpatient Hospital Stay (HOSPITAL_COMMUNITY)
Admission: EM | Admit: 2019-02-23 | Discharge: 2019-03-06 | DRG: 177 | Disposition: A | Discharge status: Home Health | Payer: Medicare Other | Attending: Internal Medicine | Admitting: Internal Medicine

## 2019-02-23 ENCOUNTER — Emergency Department (HOSPITAL_COMMUNITY): Payer: Medicare Other

## 2019-02-23 ENCOUNTER — Other Ambulatory Visit: Payer: Self-pay

## 2019-02-23 ENCOUNTER — Encounter (HOSPITAL_COMMUNITY): Payer: Self-pay

## 2019-02-23 DIAGNOSIS — J1289 Other viral pneumonia: Secondary | ICD-10-CM | POA: Diagnosis not present

## 2019-02-23 DIAGNOSIS — Z6841 Body Mass Index (BMI) 40.0 and over, adult: Secondary | ICD-10-CM

## 2019-02-23 DIAGNOSIS — E1165 Type 2 diabetes mellitus with hyperglycemia: Secondary | ICD-10-CM | POA: Diagnosis not present

## 2019-02-23 DIAGNOSIS — R0902 Hypoxemia: Secondary | ICD-10-CM | POA: Diagnosis not present

## 2019-02-23 DIAGNOSIS — Z882 Allergy status to sulfonamides status: Secondary | ICD-10-CM

## 2019-02-23 DIAGNOSIS — Z7984 Long term (current) use of oral hypoglycemic drugs: Secondary | ICD-10-CM

## 2019-02-23 DIAGNOSIS — Z880 Allergy status to penicillin: Secondary | ICD-10-CM

## 2019-02-23 DIAGNOSIS — H409 Unspecified glaucoma: Secondary | ICD-10-CM | POA: Diagnosis present

## 2019-02-23 DIAGNOSIS — E860 Dehydration: Secondary | ICD-10-CM | POA: Diagnosis present

## 2019-02-23 DIAGNOSIS — J9601 Acute respiratory failure with hypoxia: Secondary | ICD-10-CM | POA: Diagnosis present

## 2019-02-23 DIAGNOSIS — Z209 Contact with and (suspected) exposure to unspecified communicable disease: Secondary | ICD-10-CM | POA: Diagnosis not present

## 2019-02-23 DIAGNOSIS — Z8249 Family history of ischemic heart disease and other diseases of the circulatory system: Secondary | ICD-10-CM

## 2019-02-23 DIAGNOSIS — F329 Major depressive disorder, single episode, unspecified: Secondary | ICD-10-CM | POA: Diagnosis present

## 2019-02-23 DIAGNOSIS — R42 Dizziness and giddiness: Secondary | ICD-10-CM | POA: Diagnosis not present

## 2019-02-23 DIAGNOSIS — U071 COVID-19: Principal | ICD-10-CM | POA: Diagnosis present

## 2019-02-23 DIAGNOSIS — E785 Hyperlipidemia, unspecified: Secondary | ICD-10-CM | POA: Diagnosis present

## 2019-02-23 DIAGNOSIS — N179 Acute kidney failure, unspecified: Secondary | ICD-10-CM | POA: Diagnosis not present

## 2019-02-23 DIAGNOSIS — E876 Hypokalemia: Secondary | ICD-10-CM | POA: Diagnosis not present

## 2019-02-23 DIAGNOSIS — R509 Fever, unspecified: Secondary | ICD-10-CM | POA: Diagnosis not present

## 2019-02-23 DIAGNOSIS — I10 Essential (primary) hypertension: Secondary | ICD-10-CM | POA: Diagnosis present

## 2019-02-23 DIAGNOSIS — D649 Anemia, unspecified: Secondary | ICD-10-CM | POA: Diagnosis present

## 2019-02-23 DIAGNOSIS — Z20822 Contact with and (suspected) exposure to covid-19: Secondary | ICD-10-CM

## 2019-02-23 DIAGNOSIS — K529 Noninfective gastroenteritis and colitis, unspecified: Secondary | ICD-10-CM | POA: Diagnosis not present

## 2019-02-23 DIAGNOSIS — H3530 Unspecified macular degeneration: Secondary | ICD-10-CM | POA: Diagnosis present

## 2019-02-23 DIAGNOSIS — T380X5A Adverse effect of glucocorticoids and synthetic analogues, initial encounter: Secondary | ICD-10-CM | POA: Diagnosis present

## 2019-02-23 DIAGNOSIS — R112 Nausea with vomiting, unspecified: Secondary | ICD-10-CM | POA: Diagnosis present

## 2019-02-23 DIAGNOSIS — Z888 Allergy status to other drugs, medicaments and biological substances status: Secondary | ICD-10-CM

## 2019-02-23 DIAGNOSIS — Z79899 Other long term (current) drug therapy: Secondary | ICD-10-CM

## 2019-02-23 DIAGNOSIS — Z87891 Personal history of nicotine dependence: Secondary | ICD-10-CM

## 2019-02-23 DIAGNOSIS — R05 Cough: Secondary | ICD-10-CM | POA: Diagnosis not present

## 2019-02-23 DIAGNOSIS — R197 Diarrhea, unspecified: Secondary | ICD-10-CM | POA: Diagnosis present

## 2019-02-23 DIAGNOSIS — Z885 Allergy status to narcotic agent status: Secondary | ICD-10-CM

## 2019-02-23 DIAGNOSIS — R6510 Systemic inflammatory response syndrome (SIRS) of non-infectious origin without acute organ dysfunction: Secondary | ICD-10-CM | POA: Diagnosis present

## 2019-02-23 LAB — CBC WITH DIFFERENTIAL/PLATELET
Abs Immature Granulocytes: 0.02 10*3/uL (ref 0.00–0.07)
Basophils Absolute: 0 10*3/uL (ref 0.0–0.1)
Basophils Relative: 0 %
Eosinophils Absolute: 0 10*3/uL (ref 0.0–0.5)
Eosinophils Relative: 0 %
HCT: 33.7 % — ABNORMAL LOW (ref 36.0–46.0)
Hemoglobin: 11 g/dL — ABNORMAL LOW (ref 12.0–15.0)
Immature Granulocytes: 1 %
Lymphocytes Relative: 15 %
Lymphs Abs: 0.6 10*3/uL — ABNORMAL LOW (ref 0.7–4.0)
MCH: 29.5 pg (ref 26.0–34.0)
MCHC: 32.6 g/dL (ref 30.0–36.0)
MCV: 90.3 fL (ref 80.0–100.0)
Monocytes Absolute: 0.4 10*3/uL (ref 0.1–1.0)
Monocytes Relative: 10 %
Neutro Abs: 2.9 10*3/uL (ref 1.7–7.7)
Neutrophils Relative %: 74 %
Platelets: 187 10*3/uL (ref 150–400)
RBC: 3.73 MIL/uL — ABNORMAL LOW (ref 3.87–5.11)
RDW: 13.7 % (ref 11.5–15.5)
WBC: 3.9 10*3/uL — ABNORMAL LOW (ref 4.0–10.5)
nRBC: 0 % (ref 0.0–0.2)

## 2019-02-23 LAB — COMPREHENSIVE METABOLIC PANEL
ALT: 19 U/L (ref 0–44)
AST: 24 U/L (ref 15–41)
Albumin: 3.3 g/dL — ABNORMAL LOW (ref 3.5–5.0)
Alkaline Phosphatase: 40 U/L (ref 38–126)
Anion gap: 11 (ref 5–15)
BUN: 30 mg/dL — ABNORMAL HIGH (ref 8–23)
CO2: 25 mmol/L (ref 22–32)
Calcium: 8.4 mg/dL — ABNORMAL LOW (ref 8.9–10.3)
Chloride: 99 mmol/L (ref 98–111)
Creatinine, Ser: 1.35 mg/dL — ABNORMAL HIGH (ref 0.44–1.00)
GFR calc Af Amer: 45 mL/min — ABNORMAL LOW (ref >60)
GFR calc non Af Amer: 39 mL/min — ABNORMAL LOW (ref >60)
Glucose, Bld: 214 mg/dL — ABNORMAL HIGH (ref 70–99)
Potassium: 3.3 mmol/L — ABNORMAL LOW (ref 3.5–5.1)
Sodium: 135 mmol/L (ref 135–145)
Total Bilirubin: 0.6 mg/dL (ref 0.3–1.2)
Total Protein: 6.5 g/dL (ref 6.5–8.1)

## 2019-02-23 LAB — LACTIC ACID, PLASMA: Lactic Acid, Venous: 1.3 mmol/L (ref 0.5–1.9)

## 2019-02-23 MED ORDER — ACETAMINOPHEN 325 MG PO TABS
650.0000 mg | ORAL_TABLET | Freq: Once | ORAL | Status: AC
  Administered 2019-02-23: 650 mg via ORAL
  Filled 2019-02-23: qty 2

## 2019-02-23 MED ORDER — ONDANSETRON HCL 4 MG/2ML IJ SOLN
4.0000 mg | Freq: Once | INTRAMUSCULAR | Status: AC
  Administered 2019-02-23: 4 mg via INTRAVENOUS
  Filled 2019-02-23: qty 2

## 2019-02-23 NOTE — ED Notes (Signed)
Patient made aware the urine sample is needed. Patient transported to xray.

## 2019-02-23 NOTE — ED Triage Notes (Signed)
Patient coming from home with complaints of fever, N/V/D, and generalized weakness for the past five days. Temperature with EMS was 100.9. Patient is allergic to Tylenol and has not taken anything for her fever today. Patient has been sleeping all day today and has not had any of her medications. Patient was unable to ambulate to car and is normally ambulatory without device at baseline.

## 2019-02-24 ENCOUNTER — Encounter (HOSPITAL_COMMUNITY): Payer: Self-pay | Admitting: Internal Medicine

## 2019-02-24 DIAGNOSIS — R112 Nausea with vomiting, unspecified: Secondary | ICD-10-CM

## 2019-02-24 DIAGNOSIS — R6510 Systemic inflammatory response syndrome (SIRS) of non-infectious origin without acute organ dysfunction: Secondary | ICD-10-CM | POA: Diagnosis not present

## 2019-02-24 DIAGNOSIS — Z7401 Bed confinement status: Secondary | ICD-10-CM | POA: Diagnosis not present

## 2019-02-24 DIAGNOSIS — R509 Fever, unspecified: Secondary | ICD-10-CM | POA: Diagnosis not present

## 2019-02-24 DIAGNOSIS — Z79899 Other long term (current) drug therapy: Secondary | ICD-10-CM | POA: Diagnosis not present

## 2019-02-24 DIAGNOSIS — Z885 Allergy status to narcotic agent status: Secondary | ICD-10-CM | POA: Diagnosis not present

## 2019-02-24 DIAGNOSIS — Z87891 Personal history of nicotine dependence: Secondary | ICD-10-CM | POA: Diagnosis not present

## 2019-02-24 DIAGNOSIS — N179 Acute kidney failure, unspecified: Secondary | ICD-10-CM

## 2019-02-24 DIAGNOSIS — D649 Anemia, unspecified: Secondary | ICD-10-CM | POA: Diagnosis present

## 2019-02-24 DIAGNOSIS — Z8249 Family history of ischemic heart disease and other diseases of the circulatory system: Secondary | ICD-10-CM | POA: Diagnosis not present

## 2019-02-24 DIAGNOSIS — Z6841 Body Mass Index (BMI) 40.0 and over, adult: Secondary | ICD-10-CM | POA: Diagnosis not present

## 2019-02-24 DIAGNOSIS — T380X5A Adverse effect of glucocorticoids and synthetic analogues, initial encounter: Secondary | ICD-10-CM | POA: Diagnosis present

## 2019-02-24 DIAGNOSIS — R918 Other nonspecific abnormal finding of lung field: Secondary | ICD-10-CM | POA: Diagnosis not present

## 2019-02-24 DIAGNOSIS — M2550 Pain in unspecified joint: Secondary | ICD-10-CM | POA: Diagnosis not present

## 2019-02-24 DIAGNOSIS — K529 Noninfective gastroenteritis and colitis, unspecified: Secondary | ICD-10-CM | POA: Diagnosis present

## 2019-02-24 DIAGNOSIS — Z880 Allergy status to penicillin: Secondary | ICD-10-CM | POA: Diagnosis not present

## 2019-02-24 DIAGNOSIS — E1165 Type 2 diabetes mellitus with hyperglycemia: Secondary | ICD-10-CM | POA: Diagnosis present

## 2019-02-24 DIAGNOSIS — R5381 Other malaise: Secondary | ICD-10-CM | POA: Diagnosis not present

## 2019-02-24 DIAGNOSIS — R197 Diarrhea, unspecified: Secondary | ICD-10-CM | POA: Diagnosis not present

## 2019-02-24 DIAGNOSIS — F329 Major depressive disorder, single episode, unspecified: Secondary | ICD-10-CM | POA: Diagnosis present

## 2019-02-24 DIAGNOSIS — J9601 Acute respiratory failure with hypoxia: Secondary | ICD-10-CM | POA: Diagnosis not present

## 2019-02-24 DIAGNOSIS — J1289 Other viral pneumonia: Secondary | ICD-10-CM | POA: Diagnosis present

## 2019-02-24 DIAGNOSIS — I10 Essential (primary) hypertension: Secondary | ICD-10-CM | POA: Diagnosis present

## 2019-02-24 DIAGNOSIS — E860 Dehydration: Secondary | ICD-10-CM | POA: Diagnosis present

## 2019-02-24 DIAGNOSIS — H409 Unspecified glaucoma: Secondary | ICD-10-CM | POA: Diagnosis present

## 2019-02-24 DIAGNOSIS — M6281 Muscle weakness (generalized): Secondary | ICD-10-CM | POA: Diagnosis not present

## 2019-02-24 DIAGNOSIS — H3530 Unspecified macular degeneration: Secondary | ICD-10-CM | POA: Diagnosis present

## 2019-02-24 DIAGNOSIS — E876 Hypokalemia: Secondary | ICD-10-CM | POA: Diagnosis not present

## 2019-02-24 DIAGNOSIS — R41841 Cognitive communication deficit: Secondary | ICD-10-CM | POA: Diagnosis not present

## 2019-02-24 DIAGNOSIS — E785 Hyperlipidemia, unspecified: Secondary | ICD-10-CM | POA: Diagnosis present

## 2019-02-24 DIAGNOSIS — U071 COVID-19: Secondary | ICD-10-CM | POA: Diagnosis present

## 2019-02-24 DIAGNOSIS — Z7984 Long term (current) use of oral hypoglycemic drugs: Secondary | ICD-10-CM | POA: Diagnosis not present

## 2019-02-24 LAB — BASIC METABOLIC PANEL
Anion gap: 12 (ref 5–15)
BUN: 32 mg/dL — ABNORMAL HIGH (ref 8–23)
CO2: 25 mmol/L (ref 22–32)
Calcium: 8.5 mg/dL — ABNORMAL LOW (ref 8.9–10.3)
Chloride: 99 mmol/L (ref 98–111)
Creatinine, Ser: 1.57 mg/dL — ABNORMAL HIGH (ref 0.44–1.00)
GFR calc Af Amer: 38 mL/min — ABNORMAL LOW (ref >60)
GFR calc non Af Amer: 33 mL/min — ABNORMAL LOW (ref >60)
Glucose, Bld: 146 mg/dL — ABNORMAL HIGH (ref 70–99)
Potassium: 3.5 mmol/L (ref 3.5–5.1)
Sodium: 136 mmol/L (ref 135–145)

## 2019-02-24 LAB — URINALYSIS, ROUTINE W REFLEX MICROSCOPIC
Bilirubin Urine: NEGATIVE
Glucose, UA: 50 mg/dL — AB
Hgb urine dipstick: NEGATIVE
Ketones, ur: NEGATIVE mg/dL
Leukocytes,Ua: NEGATIVE
Nitrite: NEGATIVE
Protein, ur: NEGATIVE mg/dL
Specific Gravity, Urine: 1.016 (ref 1.005–1.030)
pH: 5 (ref 5.0–8.0)

## 2019-02-24 LAB — GLUCOSE, CAPILLARY
Glucose-Capillary: 127 mg/dL — ABNORMAL HIGH (ref 70–99)
Glucose-Capillary: 145 mg/dL — ABNORMAL HIGH (ref 70–99)
Glucose-Capillary: 132 mg/dL — ABNORMAL HIGH (ref 70–99)
Glucose-Capillary: 155 mg/dL — ABNORMAL HIGH (ref 70–99)

## 2019-02-24 LAB — CBC
HCT: 34.1 % — ABNORMAL LOW (ref 36.0–46.0)
Hemoglobin: 11 g/dL — ABNORMAL LOW (ref 12.0–15.0)
MCH: 29.5 pg (ref 26.0–34.0)
MCHC: 32.3 g/dL (ref 30.0–36.0)
MCV: 91.4 fL (ref 80.0–100.0)
Platelets: 187 10*3/uL (ref 150–400)
RBC: 3.73 MIL/uL — ABNORMAL LOW (ref 3.87–5.11)
RDW: 13.7 % (ref 11.5–15.5)
WBC: 3.8 10*3/uL — ABNORMAL LOW (ref 4.0–10.5)
nRBC: 0 % (ref 0.0–0.2)

## 2019-02-24 LAB — SARS CORONAVIRUS 2 (TAT 6-24 HRS): SARS Coronavirus 2: POSITIVE — AB

## 2019-02-24 MED ORDER — ONDANSETRON HCL 4 MG/2ML IJ SOLN
4.0000 mg | Freq: Four times a day (QID) | INTRAMUSCULAR | Status: DC | PRN
  Administered 2019-02-24 – 2019-02-26 (×2): 4 mg via INTRAVENOUS
  Filled 2019-02-24 (×2): qty 2

## 2019-02-24 MED ORDER — VITAMIN C 500 MG PO TABS
500.0000 mg | ORAL_TABLET | Freq: Every day | ORAL | Status: DC
  Administered 2019-02-24 – 2019-03-06 (×11): 500 mg via ORAL
  Filled 2019-02-24 (×11): qty 1

## 2019-02-24 MED ORDER — ENOXAPARIN SODIUM 40 MG/0.4ML ~~LOC~~ SOLN
40.0000 mg | SUBCUTANEOUS | Status: DC
  Administered 2019-02-24 – 2019-02-25 (×2): 40 mg via SUBCUTANEOUS
  Filled 2019-02-24 (×2): qty 0.4

## 2019-02-24 MED ORDER — ROSUVASTATIN CALCIUM 20 MG PO TABS
20.0000 mg | ORAL_TABLET | Freq: Every day | ORAL | Status: DC
  Administered 2019-02-24 – 2019-03-06 (×11): 20 mg via ORAL
  Filled 2019-02-24 (×11): qty 1

## 2019-02-24 MED ORDER — TIMOLOL MALEATE 0.5 % OP SOLN
1.0000 [drp] | Freq: Every day | OPHTHALMIC | Status: DC
  Administered 2019-02-24 – 2019-03-06 (×11): 1 [drp] via OPHTHALMIC
  Filled 2019-02-24 (×2): qty 5

## 2019-02-24 MED ORDER — INSULIN ASPART 100 UNIT/ML ~~LOC~~ SOLN
0.0000 [IU] | Freq: Three times a day (TID) | SUBCUTANEOUS | Status: DC
  Administered 2019-02-24 (×3): 1 [IU] via SUBCUTANEOUS
  Administered 2019-02-25: 5 [IU] via SUBCUTANEOUS
  Administered 2019-02-25: 12:00:00 7 [IU] via SUBCUTANEOUS
  Administered 2019-02-25: 08:00:00 3 [IU] via SUBCUTANEOUS

## 2019-02-24 MED ORDER — POTASSIUM CHLORIDE IN NACL 20-0.9 MEQ/L-% IV SOLN
INTRAVENOUS | Status: DC
  Administered 2019-02-24 (×2): via INTRAVENOUS
  Filled 2019-02-24 (×3): qty 1000

## 2019-02-24 MED ORDER — SERTRALINE HCL 50 MG PO TABS
50.0000 mg | ORAL_TABLET | Freq: Every day | ORAL | Status: DC
  Administered 2019-02-24 – 2019-03-06 (×11): 50 mg via ORAL
  Filled 2019-02-24 (×11): qty 1

## 2019-02-24 MED ORDER — DEXAMETHASONE 6 MG PO TABS
6.0000 mg | ORAL_TABLET | Freq: Every day | ORAL | Status: DC
  Administered 2019-02-24 – 2019-02-26 (×3): 6 mg via ORAL
  Filled 2019-02-24 (×3): qty 1

## 2019-02-24 MED ORDER — ONDANSETRON HCL 4 MG PO TABS: 4.0000 mg | ORAL_TABLET | Freq: Four times a day (QID) | ORAL | Status: DC | PRN

## 2019-02-24 NOTE — ED Notes (Signed)
Patient dropped to 88% on RA. Placed patient on 2 L of O2 via [place] with an improvement to 94%. Patient in NAD at this time.

## 2019-02-24 NOTE — Progress Notes (Signed)
-  Patient is a 73 year old Caucasian female past medical history significant for diabetes mellitus type 2, hypertension and hyperlipidemia. -Patient was admitted earlier today with 1 week history of nausea, vomiting and diarrhea.  Associated fever, shortness of breath and mild acute kidney injury. -On presentation, patient was tachypneic.  T-max of 102.7 F has been documented. -COVID-19 test has just come back positive. -We will transfer patient to [facility]. -Daily inflammatory markers. -Start Decadron. -Start vitamin C. -Further management depend on hospital course.

## 2019-02-24 NOTE — ED Provider Notes (Signed)
[facility] DEPT Provider Note   CSN: [id number] Arrival date & time: 02/23/19  2125     History   Chief Complaint Chief Complaint  Patient presents with  . Fever  . N/V/D  . Generalized Weakness    HPI [name] is a 73 y.o. female.  Presents emerged department with chief complaint of fever.  Symptoms have been going on for the last 5 days.  Has had nausea, intermittent episodes of vomiting.  States been having fevers for the last couple days.  Has had generalized weakness, decreased energy.  No abdominal pain.  No hematuria or dysuria.  Has had cough, nonproductive.     HPI  Past Medical History:  Diagnosis Date  . Depression   . Diabetes mellitus without complication ([place])   . Glaucoma    bilateral  . Hyperlipidemia   . Hypertension   . Macular degeneration   . Mental disorder   . SVD (spontaneous vaginal delivery)    x 3    Patient Active Problem List   Diagnosis Date Noted  . Sepsis ([place]) 09/17/2016  . Acute lower UTI 09/17/2016  . Uncontrolled type 2 diabetes mellitus with hyperglycemia ([place]) 09/17/2016  . Essential hypertension 09/17/2016    Past Surgical History:  Procedure Laterality Date  . ANAL RECTAL MANOMETRY N/A 02/12/2014   Procedure: ANO RECTAL MANOMETRY;  Surgeon: Leighton Ruff, MD;  Location: [facility];  Service: Endoscopy;  Laterality: N/A;  . APPENDECTOMY    . CYST EXCISION  09/15/2016   FACIAL CYST   . EYE SURGERY     right eye   . EYE SURGERY     bilateral cataract eye surgery  . RECTOCELE REPAIR N/A 03/02/2013   Procedure: POSTERIOR REPAIR (RECTOCELE);  Surgeon: Margarette Asal, MD;  Location: [facility];  Service: Gynecology;  Laterality: N/A;  . TONSILLECTOMY    . TOTAL VAGINAL HYSTERECTOMY  2009  . VAGINAL HYSTERECTOMY  2009   TOT done as well     OB History   No obstetric history on file.      Home Medications    Prior to Admission medications   Medication Sig Start Date End Date  Taking? Authorizing Provider  glimepiride (AMARYL) 4 MG tablet Take 4 mg by mouth daily with breakfast.   Yes [provider]  lisinopril-hydrochlorothiazide (PRINZIDE,ZESTORETIC) 20-12.5 MG per tablet Take 1 tablet by mouth daily.   Yes [provider]  metFORMIN (GLUCOPHAGE) 1000 MG tablet Take 1 tablet (1,000 mg total) by mouth 2 (two) times daily. 09/19/16  Yes Thurnell Lose, MD  Multiple Vitamin (MULTIVITAMIN WITH MINERALS) TABS tablet Take 1 tablet by mouth daily.   Yes [provider]  Multiple Vitamins-Minerals (ICAPS AREDS 2 PO) Take 1 capsule by mouth 2 (two) times daily.   Yes [provider]  rosuvastatin (CRESTOR) 20 MG tablet Take 20 mg by mouth daily.   Yes [provider]  sertraline (ZOLOFT) 50 MG tablet Take 50 mg by mouth daily. 12/08/18  Yes [provider]  timolol (TIMOPTIC) 0.5 % ophthalmic solution Place 1 drop into both eyes daily.   Yes [provider]  docusate sodium (COLACE) 100 MG capsule Take 1 capsule (100 mg total) by mouth 2 (two) times daily. Patient not taking: Reported on 09/17/2016 03/03/13   Molli Posey, MD  HYDROcodone-ibuprofen (VICOPROFEN) 7.5-200 MG per tablet Take 1 tablet by mouth every 8 (eight) hours as needed for moderate pain. Patient not taking: Reported on  09/17/2016 03/03/13   Molli Posey, MD  ibuprofen (ADVIL,MOTRIN) 800 MG tablet Take 1 tablet (800 mg total) by mouth every 8 (eight) hours as needed (mild pain). Patient not taking: Reported on 09/17/2016 03/03/13   Molli Posey, MD    Family History Family History  Problem Relation Age of Onset  . Hypertension Other     Social History Social History   Tobacco Use  . Smoking status: Former Smoker    Packs/day: 1.50    Years: 45.00    Pack years: 67.50    Types: Cigarettes  . Smokeless tobacco: Never Used  Substance Use Topics  . Alcohol use: No  . Drug use: No     Allergies   Codeine, Penicillins,  Sulfa antibiotics, and Tylenol [acetaminophen]   Review of Systems Review of Systems  Constitutional: Positive for fever. Negative for chills.  HENT: Negative for ear pain and sore throat.   Eyes: Negative for pain and visual disturbance.  Respiratory: Negative for cough and shortness of breath.   Cardiovascular: Negative for chest pain and palpitations.  Gastrointestinal: Positive for nausea and vomiting. Negative for abdominal pain.  Genitourinary: Negative for dysuria and hematuria.  Musculoskeletal: Negative for arthralgias and back pain.  Skin: Negative for color change and rash.  Neurological: Negative for seizures and syncope.  All other systems reviewed and are negative.    Physical Exam Updated Vital Signs BP 131/66 (BP Location: Right Arm)   Pulse 80   Temp 98.7 F (37.1 C) (Oral)   Resp 19   Ht 5' (1.524 m)   Wt 97.5 kg   SpO2 94%   BMI 41.99 kg/m   Physical Exam Vitals signs and nursing note reviewed.  Constitutional:      General: She is not in acute distress.    Appearance: She is well-developed.     Comments: Fatigue but no distress  HENT:     Head: Normocephalic and atraumatic.     Mouth/Throat:     Mouth: Mucous membranes are moist.     Pharynx: Oropharynx is clear.  Eyes:     Conjunctiva/sclera: Conjunctivae normal.  Neck:     Musculoskeletal: Neck supple.  Cardiovascular:     Rate and Rhythm: Normal rate and regular rhythm.     Heart sounds: No murmur.  Pulmonary:     Effort: Pulmonary effort is normal. No respiratory distress.     Breath sounds: Normal breath sounds.  Abdominal:     Palpations: Abdomen is soft.     Tenderness: There is no abdominal tenderness.  Musculoskeletal:        General: No swelling or tenderness.  Skin:    General: Skin is warm and dry.  Neurological:     General: No focal deficit present.     Mental Status: She is alert and oriented to person, place, and time.  Psychiatric:        Mood and Affect: Mood  normal.        Behavior: Behavior normal.      ED Treatments / Results  Labs (all labs ordered are listed, but only abnormal results are displayed) Labs Reviewed  COMPREHENSIVE METABOLIC PANEL - Abnormal; Notable for the following components:      Result Value   Potassium 3.3 (*)    Glucose, Bld 214 (*)    BUN 30 (*)    Creatinine, Ser 1.35 (*)    Calcium 8.4 (*)    Albumin 3.3 (*)    GFR calc non  Af Amer 39 (*)    GFR calc Af Amer 45 (*)    All other components within normal limits  CBC WITH DIFFERENTIAL/PLATELET - Abnormal; Notable for the following components:   WBC 3.9 (*)    RBC 3.73 (*)    Hemoglobin 11.0 (*)    HCT 33.7 (*)    Lymphs Abs 0.6 (*)    All other components within normal limits  SARS CORONAVIRUS 2 (TAT 6-24 HRS)  LACTIC ACID, PLASMA  URINALYSIS, ROUTINE W REFLEX MICROSCOPIC    EKG None  Radiology Dg Chest 2 View  Result Date: 02/23/2019 CLINICAL DATA:  Cough, weakness EXAM: CHEST - 2 VIEW COMPARISON:  09/24/2016 FINDINGS: Cardiomegaly. Low lung volumes with bibasilar atelectasis. No overt edema. No effusions. No acute bony abnormality. IMPRESSION: Cardiomegaly.  Bibasilar atelectasis. Electronically Signed   By: Rolm Baptise M.D.   On: 02/23/2019 22:28    Procedures Procedures (including critical care time)  Medications Ordered in ED Medications  acetaminophen (TYLENOL) tablet 650 mg (650 mg Oral Given 02/23/19 2309)  ondansetron (ZOFRAN) injection 4 mg (4 mg Intravenous Given 02/23/19 2309)     Initial Impression / Assessment and Plan / ED Course  I have reviewed the triage vital signs and the nursing notes.  Pertinent labs & imaging results that were available during my care of the patient were reviewed by me and considered in my medical decision making (see chart for details).  Clinical Course as of Feb 23 100  Fri Feb 24, 2019  0100 Signed out to [facility] pending reassessment and follow up on UA   [RD]    Clinical Course User Index  [RD] Lucrezia Starch, MD       73 year old lady presents to ER with fever, vomiting and weakness.  On exam patient appears to be fatigued, generally weak but in no acute distress.  She was initially febrile but otherwise stable vital signs.  Nontoxic-appearing.  She has an entirely soft abdomen and no abdominal pain.  Doubt acute abdominal process.  Chest x-ray without clear pneumonia.  Labs concerning for dehydration, mild AKI.  I suspect patient's symptoms are most likely due to viral illness versus urinary tract infection.  Ordered urinalysis and COVID-19 testing.  While awaiting results of UA, signed out to Dr. Nicholes Stairs.  Please refer to her note for final plan disposition.  Final Clinical Impressions(s) / ED Diagnoses   Final diagnoses:  Fever, unspecified fever cause  Nausea and vomiting, intractability of vomiting not specified, unspecified vomiting type    ED Discharge Orders    None       Lucrezia Starch, MD 02/24/19 0102

## 2019-02-24 NOTE — H&P (Signed)
History and Physical    [name] [id number] DOB: Aug 22, 1945 DOA: 02/23/2019  PCP: Alroy Dust, L.Marlou Sa, MD  Patient coming from: Home.  Chief Complaint: Nausea vomiting diarrhea fatigue and weakness.  HPI: [name] is a 73 y.o. female with history of diabetes mellitus type 2, hypertension, hyperlipidemia presents to the ER because of nausea vomiting and diarrhea ongoing for last 1 week.  Patient states she has been intensely fatigued.  Denies any shortness of breath chest pain.  Denies any blood in the vomit or diarrhea denies any recent use of antibiotics.  Denies any recent sick contacts or travel or specifically no contact with COVID-19 infections.  ED Course: In the ER patient was febrile tachypneic and lab work showed acute renal failure with creatinine of 1.3 leukopenia of 3.9 platelets 187 IMA globin COVID-19 test is pending chest x-ray unremarkable.  Patient was started on IV fluids potassium replacement and admitted for SIRS with no clear source.  Review of Systems: As per HPI, rest all negative.   Past Medical History:  Diagnosis Date  . Depression   . Diabetes mellitus without complication ([place])   . Glaucoma    bilateral  . Hyperlipidemia   . Hypertension   . Macular degeneration   . Mental disorder   . SVD (spontaneous vaginal delivery)    x 3    Past Surgical History:  Procedure Laterality Date  . ANAL RECTAL MANOMETRY N/A 02/12/2014   Procedure: ANO RECTAL MANOMETRY;  Surgeon: Leighton Ruff, MD;  Location: [facility];  Service: Endoscopy;  Laterality: N/A;  . APPENDECTOMY    . CYST EXCISION  09/15/2016   FACIAL CYST   . EYE SURGERY     right eye   . EYE SURGERY     bilateral cataract eye surgery  . RECTOCELE REPAIR N/A 03/02/2013   Procedure: POSTERIOR REPAIR (RECTOCELE);  Surgeon: Margarette Asal, MD;  Location: [facility];  Service: Gynecology;  Laterality: N/A;  . TONSILLECTOMY    . TOTAL VAGINAL HYSTERECTOMY  2009  . VAGINAL HYSTERECTOMY   2009   TOT done as well     reports that she has quit smoking. Her smoking use included cigarettes. She has a 67.50 pack-year smoking history. She has never used smokeless tobacco. She reports that she does not drink alcohol or use drugs.  Allergies  Allergen Reactions  . Codeine Nausea And Vomiting  . Penicillins Swelling    Childhood reaction  . Sulfa Antibiotics Swelling    Childhood reaction  . Tylenol [Acetaminophen] Nausea And Vomiting    Family History  Problem Relation Age of Onset  . Hypertension Other     Prior to Admission medications   Medication Sig Start Date End Date Taking? Authorizing Provider  glimepiride (AMARYL) 4 MG tablet Take 4 mg by mouth daily with breakfast.   Yes [provider]  lisinopril-hydrochlorothiazide (PRINZIDE,ZESTORETIC) 20-12.5 MG per tablet Take 1 tablet by mouth daily.   Yes [provider]  metFORMIN (GLUCOPHAGE) 1000 MG tablet Take 1 tablet (1,000 mg total) by mouth 2 (two) times daily. 09/19/16  Yes Thurnell Lose, MD  Multiple Vitamin (MULTIVITAMIN WITH MINERALS) TABS tablet Take 1 tablet by mouth daily.   Yes [provider]  Multiple Vitamins-Minerals (ICAPS AREDS 2 PO) Take 1 capsule by mouth 2 (two) times daily.   Yes [provider]  rosuvastatin (CRESTOR) 20 MG tablet Take 20 mg by mouth daily.   Yes [provider]  sertraline (ZOLOFT) 50  MG tablet Take 50 mg by mouth daily. 12/08/18  Yes [provider]  timolol (TIMOPTIC) 0.5 % ophthalmic solution Place 1 drop into both eyes daily.   Yes [provider]  docusate sodium (COLACE) 100 MG capsule Take 1 capsule (100 mg total) by mouth 2 (two) times daily. Patient not taking: Reported on 09/17/2016 03/03/13   Molli Posey, MD  HYDROcodone-ibuprofen (VICOPROFEN) 7.5-200 MG per tablet Take 1 tablet by mouth every 8 (eight) hours as needed for moderate pain. Patient not taking: Reported on 09/17/2016 03/03/13   Molli Posey, MD  ibuprofen (ADVIL,MOTRIN) 800 MG tablet Take 1 tablet (800 mg total) by mouth every 8 (eight) hours as needed (mild pain). Patient not taking: Reported on 09/17/2016 03/03/13   Molli Posey, MD    Physical Exam: Constitutional: Moderately built and nourished. Vitals:   02/24/19 0030 02/24/19 0130 02/24/19 0230 02/24/19 0330  BP: 131/66 (!) 112/54 (!) 141/58 (!) 110/59  Pulse: 80 77 75 74  Resp: 19 19 18 16   Temp: 98.7 F (37.1 C)     TempSrc: Oral     SpO2: 94% 94% 99% 99%  Weight:      Height:       Eyes: Anicteric no pallor. ENMT: No discharge from the ears eyes nose or mouth. Neck: No mass felt.  No neck rigidity. Respiratory: No rhonchi or crepitations. Cardiovascular: S1-S2 heard. Abdomen: Soft nontender bowel sounds present. Musculoskeletal: No edema.  No joint effusion. Skin: No rash. Neurologic: Alert awake oriented to time place and person.  Moves all extremities. Psychiatric: Appears normal per normal affect.   Labs on Admission: I have personally reviewed following labs and imaging studies  CBC: Recent Labs  Lab 02/23/19 2156  WBC 3.9*  NEUTROABS 2.9  HGB 11.0*  HCT 33.7*  MCV 90.3  PLT 123XX123   Basic Metabolic Panel: Recent Labs  Lab 02/23/19 2156  NA 135  K 3.3*  CL 99  CO2 25  GLUCOSE 214*  BUN 30*  CREATININE 1.35*  CALCIUM 8.4*   GFR: Estimated Creatinine Clearance: 39.4 mL/min (A) (by C-G formula based on SCr of 1.35 mg/dL (H)). Liver Function Tests: Recent Labs  Lab 02/23/19 2156  AST 24  ALT 19  ALKPHOS 40  BILITOT 0.6  PROT 6.5  ALBUMIN 3.3*   No results for input(s): LIPASE, AMYLASE in the last 168 hours. No results for input(s): AMMONIA in the last 168 hours. Coagulation Profile: No results for input(s): INR, PROTIME in the last 168 hours. Cardiac Enzymes: No results for input(s): CKTOTAL, CKMB, CKMBINDEX, TROPONINI in the last 168 hours. BNP (last 3 results) No results for input(s): PROBNP in the last 8760  hours. HbA1C: No results for input(s): HGBA1C in the last 72 hours. CBG: No results for input(s): GLUCAP in the last 168 hours. Lipid Profile: No results for input(s): CHOL, HDL, LDLCALC, TRIG, CHOLHDL, LDLDIRECT in the last 72 hours. Thyroid Function Tests: No results for input(s): TSH, T4TOTAL, FREET4, T3FREE, THYROIDAB in the last 72 hours. Anemia Panel: No results for input(s): VITAMINB12, FOLATE, FERRITIN, TIBC, IRON, RETICCTPCT in the last 72 hours. Urine analysis:    Component Value Date/Time   COLORURINE YELLOW 02/24/2019 0028   APPEARANCEUR CLEAR 02/24/2019 0028   LABSPEC 1.016 02/24/2019 0028   PHURINE 5.0 02/24/2019 0028   GLUCOSEU 50 (A) 02/24/2019 0028   HGBUR NEGATIVE 02/24/2019 0028   BILIRUBINUR NEGATIVE 02/24/2019 0028   KETONESUR NEGATIVE 02/24/2019 0028   PROTEINUR NEGATIVE 02/24/2019 0028  NITRITE NEGATIVE 02/24/2019 0028   LEUKOCYTESUR NEGATIVE 02/24/2019 0028   Sepsis Labs: @LABRCNTIP (procalcitonin:4,lacticidven:4) )No results found for this or any previous visit (from the past 240 hour(s)).   Radiological Exams on Admission: Dg Chest 2 View  Result Date: 02/23/2019 CLINICAL DATA:  Cough, weakness EXAM: CHEST - 2 VIEW COMPARISON:  09/24/2016 FINDINGS: Cardiomegaly. Low lung volumes with bibasilar atelectasis. No overt edema. No effusions. No acute bony abnormality. IMPRESSION: Cardiomegaly.  Bibasilar atelectasis. Electronically Signed   By: Rolm Baptise M.D.   On: 02/23/2019 22:28     Assessment/Plan Principal Problem:   SIRS (systemic inflammatory response syndrome) (HCC) Active Problems:   Uncontrolled type 2 diabetes mellitus with hyperglycemia (HCC)   Essential hypertension   ARF (acute renal failure) (HCC)   Nausea vomiting and diarrhea    1. SIRS will be from gastroenteritis however we have to rule out COVID-19 infection.  Clinically patient looks dehydrated for which we will give IV fluids.  2. Acute renal failure likely from nausea  vomiting and diarrhea.  Will hold lisinopril and hydrochlorothiazide and gently hydrate.  Follow metabolic panel.  UA is largely unremarkable. 3. Nausea vomiting and diarrhea primarily we have to rule out COVID-19 infection.  Abdomen appears benign.  May consider stool studies if diarrhea persists and if Covid turns out to be negative. 4. Hyperlipidemia on statins. 5. Hypertension holding hydrochlorothiazide and lisinopril due to renal failure and keep patient on as needed IV hydralazine.  Given the SIRS picture with acute renal failure with generalized weakness patient will need more than 2 midnight stay with hydration and close monitoring and will need inpatient status.   DVT prophylaxis: Lovenox. Code Status: Full code. Family Communication: Discussed with patient. Disposition Plan: Home. Consults called: None. Admission status: Inpatient.   Rise Patience MD [facility] Pager [phone].  If 7PM-7AM, please contact night-coverage [email or web address] Password [email or web address]  02/24/2019, 3:55 AM

## 2019-02-24 NOTE — Progress Notes (Signed)
Text paged MD regarding patient temperature of 102.7 & requested Tylenol.

## 2019-02-25 LAB — ABO/RH: ABO/RH(D): A POS

## 2019-02-25 LAB — RENAL FUNCTION PANEL
Albumin: 2.9 g/dL — ABNORMAL LOW (ref 3.5–5.0)
Albumin: 3 g/dL — ABNORMAL LOW (ref 3.5–5.0)
Anion gap: 11 (ref 5–15)
Anion gap: 13 (ref 5–15)
BUN: 29 mg/dL — ABNORMAL HIGH (ref 8–23)
BUN: 29 mg/dL — ABNORMAL HIGH (ref 8–23)
CO2: 22 mmol/L (ref 22–32)
CO2: 23 mmol/L (ref 22–32)
Calcium: 8.1 mg/dL — ABNORMAL LOW (ref 8.9–10.3)
Calcium: 8.1 mg/dL — ABNORMAL LOW (ref 8.9–10.3)
Chloride: 103 mmol/L (ref 98–111)
Chloride: 104 mmol/L (ref 98–111)
Creatinine, Ser: 1.08 mg/dL — ABNORMAL HIGH (ref 0.44–1.00)
Creatinine, Ser: 1.09 mg/dL — ABNORMAL HIGH (ref 0.44–1.00)
GFR calc Af Amer: 59 mL/min — ABNORMAL LOW (ref >60)
GFR calc Af Amer: 59 mL/min — ABNORMAL LOW (ref >60)
GFR calc non Af Amer: 51 mL/min — ABNORMAL LOW (ref >60)
GFR calc non Af Amer: 51 mL/min — ABNORMAL LOW (ref >60)
Glucose, Bld: 233 mg/dL — ABNORMAL HIGH (ref 70–99)
Glucose, Bld: 239 mg/dL — ABNORMAL HIGH (ref 70–99)
Phosphorus: 3.6 mg/dL (ref 2.5–4.6)
Phosphorus: 3.7 mg/dL (ref 2.5–4.6)
Potassium: 4.1 mmol/L (ref 3.5–5.1)
Potassium: 4.1 mmol/L (ref 3.5–5.1)
Sodium: 138 mmol/L (ref 135–145)
Sodium: 138 mmol/L (ref 135–145)

## 2019-02-25 LAB — FERRITIN: Ferritin: 111 ng/mL (ref 11–307)

## 2019-02-25 LAB — CBC WITH DIFFERENTIAL/PLATELET
Abs Immature Granulocytes: 0.01 10*3/uL (ref 0.00–0.07)
Basophils Absolute: 0 10*3/uL (ref 0.0–0.1)
Basophils Relative: 0 %
Eosinophils Absolute: 0 10*3/uL (ref 0.0–0.5)
Eosinophils Relative: 0 %
HCT: 31.9 % — ABNORMAL LOW (ref 36.0–46.0)
Hemoglobin: 10 g/dL — ABNORMAL LOW (ref 12.0–15.0)
Immature Granulocytes: 1 %
Lymphocytes Relative: 26 %
Lymphs Abs: 0.5 10*3/uL — ABNORMAL LOW (ref 0.7–4.0)
MCH: 29.2 pg (ref 26.0–34.0)
MCHC: 31.3 g/dL (ref 30.0–36.0)
MCV: 93 fL (ref 80.0–100.0)
Monocytes Absolute: 0.1 10*3/uL (ref 0.1–1.0)
Monocytes Relative: 5 %
Neutro Abs: 1.2 10*3/uL — ABNORMAL LOW (ref 1.7–7.7)
Neutrophils Relative %: 68 %
Platelets: 202 10*3/uL (ref 150–400)
RBC: 3.43 MIL/uL — ABNORMAL LOW (ref 3.87–5.11)
RDW: 13.7 % (ref 11.5–15.5)
WBC: 1.7 10*3/uL — ABNORMAL LOW (ref 4.0–10.5)
nRBC: 0 % (ref 0.0–0.2)

## 2019-02-25 LAB — GLUCOSE, CAPILLARY
Glucose-Capillary: 211 mg/dL — ABNORMAL HIGH (ref 70–99)
Glucose-Capillary: 327 mg/dL — ABNORMAL HIGH (ref 70–99)
Glucose-Capillary: 283 mg/dL — ABNORMAL HIGH (ref 70–99)
Glucose-Capillary: 324 mg/dL — ABNORMAL HIGH (ref 70–99)

## 2019-02-25 LAB — TYPE AND SCREEN
ABO/RH(D): A POS
Antibody Screen: NEGATIVE

## 2019-02-25 LAB — MAGNESIUM: Magnesium: 1.7 mg/dL (ref 1.7–2.4)

## 2019-02-25 LAB — C-REACTIVE PROTEIN: CRP: 10 mg/dL — ABNORMAL HIGH (ref <1.0)

## 2019-02-25 MED ORDER — INSULIN GLARGINE 100 UNIT/ML ~~LOC~~ SOLN
10.0000 [IU] | Freq: Every day | SUBCUTANEOUS | Status: DC
  Administered 2019-02-25 – 2019-02-26 (×2): 10 [IU] via SUBCUTANEOUS
  Filled 2019-02-25 (×3): qty 0.1

## 2019-02-25 MED ORDER — INSULIN ASPART 100 UNIT/ML ~~LOC~~ SOLN
0.0000 [IU] | Freq: Three times a day (TID) | SUBCUTANEOUS | Status: DC
  Administered 2019-02-26: 7 [IU] via SUBCUTANEOUS
  Administered 2019-02-26: 09:00:00 3 [IU] via SUBCUTANEOUS
  Administered 2019-02-27 (×3): 11 [IU] via SUBCUTANEOUS
  Administered 2019-02-28: 12:00:00 3 [IU] via SUBCUTANEOUS
  Administered 2019-02-28 – 2019-03-01 (×3): 11 [IU] via SUBCUTANEOUS
  Administered 2019-03-01: 08:00:00 7 [IU] via SUBCUTANEOUS
  Administered 2019-03-02: 13:00:00 3 [IU] via SUBCUTANEOUS
  Administered 2019-03-02: 16:00:00 20 [IU] via SUBCUTANEOUS
  Administered 2019-03-03: 12:00:00 4 [IU] via SUBCUTANEOUS
  Administered 2019-03-03: 09:00:00 3 [IU] via SUBCUTANEOUS
  Administered 2019-03-04: 12:00:00 4 [IU] via SUBCUTANEOUS
  Administered 2019-03-04: 18:00:00 11 [IU] via SUBCUTANEOUS
  Administered 2019-03-05 (×2): 4 [IU] via SUBCUTANEOUS
  Administered 2019-03-06: 12:00:00 3 [IU] via SUBCUTANEOUS
  Administered 2019-03-06: 08:00:00 4 [IU] via SUBCUTANEOUS

## 2019-02-25 MED ORDER — SODIUM CHLORIDE 0.9 % IV SOLN
100.0000 mg | INTRAVENOUS | Status: AC
  Administered 2019-02-26 – 2019-03-01 (×4): 100 mg via INTRAVENOUS
  Filled 2019-02-25 (×4): qty 20

## 2019-02-25 MED ORDER — ENOXAPARIN SODIUM 60 MG/0.6ML ~~LOC~~ SOLN: 50.0000 mg | SUBCUTANEOUS | Status: DC

## 2019-02-25 MED ORDER — ENOXAPARIN SODIUM 40 MG/0.4ML ~~LOC~~ SOLN
40.0000 mg | SUBCUTANEOUS | Status: DC
  Administered 2019-02-26 – 2019-02-28 (×3): 40 mg via SUBCUTANEOUS
  Filled 2019-02-25 (×3): qty 0.4

## 2019-02-25 MED ORDER — INSULIN ASPART 100 UNIT/ML ~~LOC~~ SOLN
0.0000 [IU] | Freq: Every day | SUBCUTANEOUS | Status: DC
  Administered 2019-02-25: 4 [IU] via SUBCUTANEOUS
  Administered 2019-02-26: 22:00:00 3 [IU] via SUBCUTANEOUS
  Administered 2019-02-27: 21:00:00 2 [IU] via SUBCUTANEOUS
  Administered 2019-02-28 – 2019-03-01 (×2): 3 [IU] via SUBCUTANEOUS
  Administered 2019-03-02: 22:00:00 2 [IU] via SUBCUTANEOUS
  Administered 2019-03-03: 21:00:00 5 [IU] via SUBCUTANEOUS

## 2019-02-25 MED ORDER — SODIUM CHLORIDE 0.9 % IV SOLN
200.0000 mg | Freq: Once | INTRAVENOUS | Status: AC
  Administered 2019-02-25: 18:00:00 200 mg via INTRAVENOUS
  Filled 2019-02-25: qty 40

## 2019-02-25 MED ORDER — INSULIN ASPART 100 UNIT/ML ~~LOC~~ SOLN
3.0000 [IU] | Freq: Three times a day (TID) | SUBCUTANEOUS | Status: DC
  Administered 2019-02-27 – 2019-03-06 (×21): 3 [IU] via SUBCUTANEOUS

## 2019-02-25 NOTE — Progress Notes (Signed)
PROGRESS NOTE    [name]  [id number] DOB: 29-Aug-1945 DOA: 02/23/2019 PCP: Alroy Dust, L.Marlou Sa, MD   Brief Narrative:  [name] is [name] 73 y.o. Robertson with history of diabetes mellitus type 2, hypertension, hyperlipidemia presents to the ER because of nausea vomiting and diarrhea ongoing for last 1 week.  Patient states she has been intensely fatigued.  Denies any shortness of breath chest pain.  Denies any blood in the vomit or diarrhea denies any recent use of antibiotics.  Denies any recent sick contacts or travel or specifically no contact with COVID-19 infections.  ED Course: In the ER patient was febrile tachypneic and lab work showed acute renal failure with creatinine of 1.3 leukopenia of 3.9 platelets 187 IMA globin COVID-19 test is pending chest x-ray unremarkable.  Patient was started on IV fluids potassium replacement and admitted for SIRS with no clear source.  Assessment & Plan:   Principal Problem:   SIRS (systemic inflammatory response syndrome) (HCC) Active Problems:   Uncontrolled type 2 diabetes mellitus with hyperglycemia (HCC)   Essential hypertension   ARF (acute renal failure) (HCC)   Nausea vomiting and diarrhea   1. COVID 19 Viral Infection  Nausea  Vomiting  Diarrhea;  1. Currently on 3 L [place], started on steroids and remdesivir 2. Follow repeat CXR in AM 3. Daily labs - CBC/CMP/D dimer/CRP/ferritin 4. I/O, daily weights 5. Wean O2 as tolerated  6. Follow GI sx, w/u further as indicated  COVID-19 Labs  Recent Labs    02/25/19 0423  FERRITIN 111  CRP 10.0*    Lab Results  Component Value Date   SARSCOV2NAA POSITIVE ([name]) 02/24/2019    2. Acute renal failure likely from nausea vomiting and diarrhea 1. Creatinine peaked at 1.57 2. Improved today 3. UA wnl   3. Hyperlipidemia on statins.  4. T2DM: start lantus, mealtime insulin, and SSI - follow A1c  5. Hypertension holding hydrochlorothiazide and lisinopril due to renal failure.   Improved, continue to monitor.  Consider restarting tomorrow.  6. Leukopenia: follow CBC with diff, mild neutropenia  DVT prophylaxis: lveonx Code Status: full Family Communication: none at bedside Disposition Plan: pending further improvemetn  Consultants:   none  Procedures:   none  Antimicrobials: Anti-infectives (From admission, onward)   Start     Dose/Rate Route Frequency Ordered Stop   02/26/19 1600  remdesivir 100 mg in sodium chloride 0.9 % 250 mL IVPB     100 mg 500 mL/hr over 30 Minutes Intravenous Every 24 hours 02/25/19 1335 03/02/19 1559   02/25/19 1600  remdesivir 200 mg in sodium chloride 0.9 % 250 mL IVPB     200 mg 500 mL/hr over 30 Minutes Intravenous Once 02/25/19 1335 02/25/19 1812      Subjective: Feels better Sx started before halloween Feels SOB Denies CP  Objective: Vitals:   02/25/19 0833 02/25/19 1121 02/25/19 1330 02/25/19 1554  BP:  121/68 (!) 146/73 (!) 149/62  Pulse:  73 69   Resp:  20 18   Temp:  98.7 F (37.1 C) 98.5 F (36.9 C) 98.2 F (36.8 C)  TempSrc:  Oral Oral Oral  SpO2: 94% 94% 96%   Weight:      Height:        Intake/Output Summary (Last 24 hours) at 02/25/2019 1922 Last data filed at 02/25/2019 1000 Gross per 24 hour  Intake 1392.43 ml  Output 1050 ml  Net 342.43 ml   Filed Weights   02/23/19 2153  Weight: 97.5 kg    Examination:  General exam: Appears calm and comfortable  Respiratory system: unlabored Cardiovascular system: RRR Gastrointestinal system: s/nt/nd Central nervous system: Alert and oriented. No focal neurological deficits. Extremities: no LEE Skin: No rashes, lesions or ulcers Psychiatry: Judgement and insight appear normal. Mood & affect appropriate.     Data Reviewed: I have personally reviewed following labs and imaging studies  CBC: Recent Labs  Lab 02/23/19 2156 02/24/19 0423 02/25/19 0423  WBC 3.9* 3.8* 1.7*  NEUTROABS 2.9  --  1.2*  HGB 11.0* 11.0* 10.0*  HCT 33.7*  34.1* 31.9*  MCV 90.3 91.4 93.0  PLT 187 187 [id number]   Basic Metabolic Panel: Recent Labs  Lab 02/23/19 2156 02/24/19 0423 02/25/19 0423  NA 135 136 138  138  K 3.3* 3.5 4.1  4.1  CL 99 99 104  103  CO2 25 25 23  22   GLUCOSE 214* 146* 239*  233*  BUN 30* 32* 29*  29*  CREATININE 1.35* 1.57* 1.08*  1.09*  CALCIUM 8.4* 8.5* 8.1*  8.1*  MG  --   --  1.7  PHOS  --   --  3.7  3.6   GFR: Estimated Creatinine Clearance: 49.3 mL/min ([name]) (by C-G formula based on SCr of 1.08 mg/dL (H)). Liver Function Tests: Recent Labs  Lab 02/23/19 2156 02/25/19 0423  AST 24  --   ALT 19  --   ALKPHOS 40  --   BILITOT 0.6  --   PROT 6.5  --   ALBUMIN 3.3* 3.0*  2.9*   No results for input(s): LIPASE, AMYLASE in the last 168 hours. No results for input(s): AMMONIA in the last 168 hours. Coagulation Profile: No results for input(s): INR, PROTIME in the last 168 hours. Cardiac Enzymes: No results for input(s): CKTOTAL, CKMB, CKMBINDEX, TROPONINI in the last 168 hours. BNP (last 3 results) No results for input(s): PROBNP in the last 8760 hours. HbA1C: No results for input(s): HGBA1C in the last 72 hours. CBG: Recent Labs  Lab 02/24/19 1737 02/24/19 2145 02/25/19 0813 02/25/19 1118 02/25/19 1627  GLUCAP 132* 155* 211* 327* 283*   Lipid Profile: No results for input(s): CHOL, HDL, LDLCALC, TRIG, CHOLHDL, LDLDIRECT in the last 72 hours. Thyroid Function Tests: No results for input(s): TSH, T4TOTAL, FREET4, T3FREE, THYROIDAB in the last 72 hours. Anemia Panel: Recent Labs    02/25/19 0423  FERRITIN 111   Sepsis Labs: Recent Labs  Lab 02/23/19 2156  LATICACIDVEN 1.3    Recent Results (from the past 240 hour(s))  SARS CORONAVIRUS 2 (TAT 6-24 HRS) Nasopharyngeal Nasopharyngeal Swab     Status: Abnormal   Collection Time: 02/24/19 12:28 AM   Specimen: Nasopharyngeal Swab  Result Value Ref Range Status   SARS Coronavirus 2 POSITIVE ([name]) NEGATIVE Final    Comment: RESULT  CALLED TO, READ BACK BY AND VERIFIED WITH: [name] 15:15 02/24/19 ([email or web address]) (NOTE) SARS-CoV-2 target nucleic acids are DETECTED. The SARS-CoV-2 RNA is generally detectable in upper and lower respiratory specimens during the acute phase of infection. Positive results are indicative of active infection with SARS-CoV-2. Clinical  correlation with patient history and other diagnostic information is necessary to determine patient infection status. Positive results do  not rule out bacterial infection or co-infection with other viruses. The expected result is Negative. Fact Sheet for Patients: SugarRoll.be Fact Sheet for Healthcare Providers: https://www.woods-mathews.com/ This test is not yet approved or cleared by the [place] FDA and  has been  authorized for detection and/or diagnosis of SARS-CoV-2 by FDA under an Emergency Use Authorization (EUA). This EUA will remain  in effect (meaning this test can be used) fo r the duration of the COVID-19 declaration under Section 564(b)(1) of the Act, 21 U.S.C. section 360bbb-3(b)(1), unless the authorization is terminated or revoked sooner. Performed at [facility], [place] [address], [place], [place] [address]          Radiology Studies: Dg Chest 2 View  Result Date: 02/23/2019 CLINICAL DATA:  Cough, weakness EXAM: CHEST - 2 VIEW COMPARISON:  09/24/2016 FINDINGS: Cardiomegaly. Low lung volumes with bibasilar atelectasis. No overt edema. No effusions. No acute bony abnormality. IMPRESSION: Cardiomegaly.  Bibasilar atelectasis. Electronically Signed   By: Rolm Baptise M.D.   On: 02/23/2019 22:28        Scheduled Meds: . dexamethasone  6 mg Oral Daily  . [START ON 02/26/2019] insulin aspart  0-20 Units Subcutaneous TID WC  . insulin aspart  0-5 Units Subcutaneous QHS  . [START ON 02/26/2019] insulin aspart  3 Units Subcutaneous TID WC  . insulin glargine  10 Units Subcutaneous QHS   . rosuvastatin  20 mg Oral Daily  . sertraline  50 mg Oral Daily  . timolol  1 drop Both Eyes Daily  . vitamin C  500 mg Oral Daily   Continuous Infusions: . [START ON 02/26/2019] remdesivir 100 mg in NS 250 mL       LOS: 1 day    Time spent: over 30 min    Fayrene Helper, MD [facility] Pager AMION  If 7PM-7AM, please contact night-coverage [email or web address] Password [facility] 02/25/2019, 7:22 PM

## 2019-02-25 NOTE — Progress Notes (Signed)
Remdesivir - Pharmacy Brief Note   O:  ALT: 19 CXR: Cardiomegaly.  Bibasilar atelectasis. SpO2: 96% on 3L [place]   A/P:  Remdesivir 200 mg once followed by 100 mg daily x 4 days.   Gretta Arab PharmD, BCPS Clinical pharmacist phone 7am- 5pm: [phone] 02/25/2019 1:33 PM

## 2019-02-25 NOTE — Progress Notes (Signed)
[facility] aware of transfer need to [place]. Plan is to pick up patient around 1200. Will continue to monitor patient and call report to [place].

## 2019-02-25 NOTE — Evaluation (Signed)
Physical Therapy Evaluation Patient Details Name: [name] MRN: [id number] DOB: 05-08-1945 Today's Date: 02/25/2019   History of Present Illness  73 y/o female w/ hx of mental disorder, macular degeneration, HTN, HLD, galucoma, DM, depression, presented to ED w/ c/o fever N/V/D and gen weakness. Admitted iwth SIRS adn ARF, 11/6 COVID test found +  Clinical Impression   Pt admitted with above diagnosis. PTA states was living home independent, from chart review and speaking with pt, she seems to be slightly unreliable in her reporting of PLOF. Pt currently with functional limitations due to the deficits listed below (see PT Problem List). This pm pt presents with decreases in overall strength, balance and coordination, activity tolerance and independence. She is on 3L/min via [place] and able to maintain sats in 80-90s range during assessment. Also noted a slight tremor with intention. Pt reports she does not need AD but does much better with RW and with min a. Initiated use of flutter valve and also incentive spirometer. Pt will benefit from skilled PT to increase their independence and safety with mobility to allow discharge to the venue listed below.       Follow Up Recommendations Home health PT    Equipment Recommendations  Rolling walker with 5" wheels    Recommendations for Other Services OT consult     Precautions / Restrictions Precautions Precautions: Fall Restrictions Weight Bearing Restrictions: No      Mobility  Bed Mobility Overal bed mobility: Needs Assistance Bed Mobility: Supine to Sit     Supine to sit: Min assist        Transfers Overall transfer level: Needs assistance Equipment used: Rolling walker (2 wheeled) Transfers: Sit to/from [facility] Sit to Stand: Min guard Stand pivot transfers: Min assist          Ambulation/Gait             General Gait Details: Pt denies use of AD at home, denied need for use of walker during  assessment attempted to stand and ambulate with poor balance. Also noted pt has intention tremors  Stairs            Wheelchair Mobility    Modified Rankin (Stroke Patients Only)       Balance Overall balance assessment: Needs assistance Sitting-balance support: Feet unsupported;Bilateral upper extremity supported Sitting balance-Leahy Scale: Fair     Standing balance support: Bilateral upper extremity supported Standing balance-Leahy Scale: Poor Standing balance comment: needs min a and RW to maintain stance safely                             Pertinent Vitals/Pain Pain Assessment: No/denies pain    Home Living Family/patient expects to be discharged to:: Private residence Living Arrangements: Children Available Help at Discharge: Family Type of Home: House Home Access: Stairs to enter   Technical brewer of Steps: 1 Home Layout: One level        Prior Function Level of Independence: Independent               Hand Dominance   Dominant Hand: Right    Extremity/Trunk Assessment                Communication   Communication: No difficulties  Cognition Arousal/Alertness: Awake/alert Behavior During Therapy: Flat affect Overall Cognitive Status: No family/caregiver present to determine baseline cognitive functioning  General Comments: seems to be off from baseline from chart review      General Comments      Exercises Other Exercises Other Exercises: completed standing and marching in place x 12 B w/ RW for UE support and min a Other Exercises: initiated use of flutter valve able to complete x 5 reps with cues Other Exercises: initiated use of IS, w/ cues able to complete x 5 reps pulling 772ml   Assessment/Plan    PT Assessment Patient needs continued PT services  PT Problem List Decreased strength;Decreased activity tolerance;Decreased balance;Decreased mobility;Decreased  coordination;Decreased safety awareness       PT Treatment Interventions Gait training;Functional mobility training;Therapeutic activities;Therapeutic exercise;Balance training;Neuromuscular re-education;Patient/family education    PT Goals (Current goals can be found in the Care Plan section)  Acute Rehab PT Goals Patient Stated Goal: did not state goals at this time PT Goal Formulation: With patient Time For Goal Achievement: 03/11/19 Potential to Achieve Goals: Fair    Frequency Min 2X/week   Barriers to discharge        Co-evaluation               AM-PAC PT "6 Clicks" Mobility  Outcome Measure Help needed turning from your back to your side while in a flat bed without using bedrails?: A Little Help needed moving from lying on your back to sitting on the side of a flat bed without using bedrails?: A Little Help needed moving to and from a bed to a chair (including a wheelchair)?: A Little Help needed standing up from a chair using your arms (e.g., wheelchair or bedside chair)?: A Little Help needed to walk in hospital room?: A Lot Help needed climbing 3-5 steps with a railing? : A Lot 6 Click Score: 16    End of Session Equipment Utilized During Treatment: Oxygen Activity Tolerance: Treatment limited secondary to medical complications (Comment);Patient limited by fatigue;Patient limited by lethargy Patient left: in chair;with call bell/phone within reach Nurse Communication: Mobility status PT Visit Diagnosis: Unsteadiness on feet (R26.81);Muscle weakness (generalized) (M62.81)    Time: [id number] PT Time Calculation (min) (ACUTE ONLY): 24 min   Charges:   PT Evaluation $PT Eval Moderate Complexity: 1 Mod PT Treatments $Therapeutic Activity: 8-22 mins        Horald Chestnut, PT   Delford Field 02/25/2019, 4:12 PM

## 2019-02-25 NOTE — Progress Notes (Signed)
Pt's daughter, [name] called and given an update on her mother's condition. Daughter would like to have a phone update from MD daily. Daughter's questions answered regarding POC and verbalizes understanding at this time.

## 2019-02-25 NOTE — Progress Notes (Signed)
Patient's daughter, Laurelyn Sickle, called and updated by this RN. [name] aware of transfer to [facility] and room # pt will be transferred to. [facility] here at this time.

## 2019-02-25 NOTE — Progress Notes (Signed)
Patient arrived to unit. AOx4

## 2019-02-26 ENCOUNTER — Inpatient Hospital Stay (HOSPITAL_COMMUNITY): Payer: Medicare Other

## 2019-02-26 LAB — CBC WITH DIFFERENTIAL/PLATELET
Abs Immature Granulocytes: 0.02 10*3/uL (ref 0.00–0.07)
Basophils Absolute: 0 10*3/uL (ref 0.0–0.1)
Basophils Relative: 0 %
Eosinophils Absolute: 0 10*3/uL (ref 0.0–0.5)
Eosinophils Relative: 0 %
HCT: 31.8 % — ABNORMAL LOW (ref 36.0–46.0)
Hemoglobin: 10.3 g/dL — ABNORMAL LOW (ref 12.0–15.0)
Immature Granulocytes: 0 %
Lymphocytes Relative: 11 %
Lymphs Abs: 0.6 10*3/uL — ABNORMAL LOW (ref 0.7–4.0)
MCH: 28.9 pg (ref 26.0–34.0)
MCHC: 32.4 g/dL (ref 30.0–36.0)
MCV: 89.3 fL (ref 80.0–100.0)
Monocytes Absolute: 0.3 10*3/uL (ref 0.1–1.0)
Monocytes Relative: 6 %
Neutro Abs: 4.2 10*3/uL (ref 1.7–7.7)
Neutrophils Relative %: 83 %
Platelets: 229 10*3/uL (ref 150–400)
RBC: 3.56 MIL/uL — ABNORMAL LOW (ref 3.87–5.11)
RDW: 13.2 % (ref 11.5–15.5)
WBC: 5.1 10*3/uL (ref 4.0–10.5)
nRBC: 0 % (ref 0.0–0.2)

## 2019-02-26 LAB — COMPREHENSIVE METABOLIC PANEL
ALT: 26 U/L (ref 0–44)
AST: 39 U/L (ref 15–41)
Albumin: 2.7 g/dL — ABNORMAL LOW (ref 3.5–5.0)
Alkaline Phosphatase: 37 U/L — ABNORMAL LOW (ref 38–126)
Anion gap: 8 (ref 5–15)
BUN: 28 mg/dL — ABNORMAL HIGH (ref 8–23)
CO2: 25 mmol/L (ref 22–32)
Calcium: 8.5 mg/dL — ABNORMAL LOW (ref 8.9–10.3)
Chloride: 103 mmol/L (ref 98–111)
Creatinine, Ser: 0.88 mg/dL (ref 0.44–1.00)
GFR calc Af Amer: >60 mL/min (ref >60)
GFR calc non Af Amer: >60 mL/min (ref >60)
Glucose, Bld: 192 mg/dL — ABNORMAL HIGH (ref 70–99)
Potassium: 3.6 mmol/L (ref 3.5–5.1)
Sodium: 136 mmol/L (ref 135–145)
Total Bilirubin: 0.5 mg/dL (ref 0.3–1.2)
Total Protein: 6 g/dL — ABNORMAL LOW (ref 6.5–8.1)

## 2019-02-26 LAB — GLUCOSE, CAPILLARY
Glucose-Capillary: 139 mg/dL — ABNORMAL HIGH (ref 70–99)
Glucose-Capillary: 108 mg/dL — ABNORMAL HIGH (ref 70–99)
Glucose-Capillary: 208 mg/dL — ABNORMAL HIGH (ref 70–99)
Glucose-Capillary: 270 mg/dL — ABNORMAL HIGH (ref 70–99)

## 2019-02-26 LAB — FERRITIN: Ferritin: 126 ng/mL (ref 11–307)

## 2019-02-26 LAB — HEMOGLOBIN A1C
Hgb A1c MFr Bld: 7.3 % — ABNORMAL HIGH (ref 4.8–5.6)
Mean Plasma Glucose: 162.81 mg/dL

## 2019-02-26 LAB — D-DIMER, QUANTITATIVE: D-Dimer, Quant: 0.51 ug/mL-FEU — ABNORMAL HIGH (ref 0.00–0.50)

## 2019-02-26 LAB — C-REACTIVE PROTEIN: CRP: 4.4 mg/dL — ABNORMAL HIGH (ref <1.0)

## 2019-02-26 LAB — MAGNESIUM: Magnesium: 1.5 mg/dL — ABNORMAL LOW (ref 1.7–2.4)

## 2019-02-26 LAB — BRAIN NATRIURETIC PEPTIDE: B Natriuretic Peptide: 190.4 pg/mL — ABNORMAL HIGH (ref 0.0–100.0)

## 2019-02-26 MED ORDER — MAGNESIUM SULFATE 2 GM/50ML IV SOLN
2.0000 g | Freq: Once | INTRAVENOUS | Status: AC
  Administered 2019-02-26: 2 g via INTRAVENOUS
  Filled 2019-02-26: qty 50

## 2019-02-26 MED ORDER — FUROSEMIDE 10 MG/ML IJ SOLN
40.0000 mg | Freq: Once | INTRAMUSCULAR | Status: AC
  Administered 2019-02-26: 14:00:00 40 mg via INTRAVENOUS
  Filled 2019-02-26: qty 4

## 2019-02-26 MED ORDER — LINAGLIPTIN 5 MG PO TABS
5.0000 mg | ORAL_TABLET | Freq: Every day | ORAL | Status: DC
  Administered 2019-02-26 – 2019-03-04 (×7): 5 mg via ORAL
  Filled 2019-02-26 (×7): qty 1

## 2019-02-26 MED ORDER — TRAZODONE HCL 50 MG PO TABS
50.0000 mg | ORAL_TABLET | Freq: Every day | ORAL | Status: DC
  Administered 2019-02-26 – 2019-02-27 (×2): 50 mg via ORAL
  Filled 2019-02-26 (×2): qty 1

## 2019-02-26 MED ORDER — METHYLPREDNISOLONE SODIUM SUCC 40 MG IJ SOLR
40.0000 mg | Freq: Two times a day (BID) | INTRAMUSCULAR | Status: AC
  Administered 2019-02-26 – 2019-02-28 (×5): 40 mg via INTRAVENOUS
  Filled 2019-02-26 (×5): qty 1

## 2019-02-26 NOTE — Evaluation (Signed)
Occupational Therapy Evaluation Patient Details Name: [name] MRN: [id number] DOB: [birth year] Today's Date: 02/26/2019    History of Present Illness 73 y/o female w/ hx of mental disorder, macular degeneration, HTN, HLD, galucoma, DM, depression, presented to ED w/ c/o fever N/V/D and gen weakness. Admitted iwth SIRS adn ARF, 11/6 COVID test found +   Clinical Impression   PTA, pt was living with her daughter and was independent. Pt currently requiring Min A for UB ADLs, Mod-Max A for LB ADLs, and Min A for functional transfers. Pt presenting with decreased activity tolerance, strength, and balance. Pt requiring 3L O2 during activity dropping to 84% after functional transfer; cued for purse lip breathing she she returned to >90% on 3L. Pt would benefit from further acute OT to facilitate safe dc. Recommend dc to home with HHOT for further OT to optimize safety, independence with ADLs, and return to PLOF.      Follow Up Recommendations  Home health OT;Supervision/Assistance - 24 hour ; pending progress, may need rehab placement   Equipment Recommendations  3 in 1 bedside commode    Recommendations for Other Services PT consult     Precautions / Restrictions Precautions Precautions: Fall Restrictions Weight Bearing Restrictions: No      Mobility Bed Mobility Overal bed mobility: Needs Assistance Bed Mobility: Supine to Sit     Supine to sit: Min guard;HOB elevated     General bed mobility comments: Min Guard A for safety with increased time and effort  Transfers Overall transfer level: Needs assistance Equipment used: None Transfers: Sit to/from [facility] Sit to Stand: Min assist Stand pivot transfers: Min assist       General transfer comment: Min A for power up into standing and then to maintain balance during pivot to recliner. Pt with slight posterior lean    Balance Overall balance assessment: Needs assistance Sitting-balance support:  Feet unsupported;Bilateral upper extremity supported;No upper extremity supported Sitting balance-Leahy Scale: Fair     Standing balance support: No upper extremity supported;During functional activity Standing balance-Leahy Scale: Poor Standing balance comment: Reliant on Min A for maintaining balance                           ADL either performed or assessed with clinical judgement   ADL Overall ADL's : Needs assistance/impaired Eating/Feeding: Set up;Sitting   Grooming: Set up;Sitting   Upper Body Bathing: Minimal assistance;Sitting   Lower Body Bathing: Moderate assistance;Sit to/from stand   Upper Body Dressing : Minimal assistance;Sitting   Lower Body Dressing: Maximal assistance;Sit to/from stand Lower Body Dressing Details (indicate cue type and reason): Pt attempting to don socks but fatigues quickly. Requiring Max A for donning socks while seated at EOB Toilet Transfer: Minimal assistance;Stand-pivot(simulated to recliner) Toilet Transfer Details (indicate cue type and reason): Min A for power up and to maintain balance. Pt with slight posterior lean         Functional mobility during ADLs: Minimal assistance(stand pivot only) General ADL Comments: Pt presenting with decreased balance, activity tolerance, and strength.     Vision Baseline Vision/History: Macular Degeneration Patient Visual Report: Other (comment)(will use a magnifying glass x5 for reading)       Perception     Praxis      Pertinent Vitals/Pain Pain Assessment: No/denies pain     Hand Dominance Right   Extremity/Trunk Assessment Upper Extremity Assessment Upper Extremity Assessment: Generalized weakness   Lower Extremity Assessment Lower  Extremity Assessment: Defer to PT evaluation   Cervical / Trunk Assessment Cervical / Trunk Assessment: Other exceptions Cervical / Trunk Exceptions: increased body habitus   Communication Communication Communication: No difficulties    Cognition Arousal/Alertness: Awake/alert Behavior During Therapy: WFL for tasks assessed/performed Overall Cognitive Status: No family/caregiver present to determine baseline cognitive functioning                                 General Comments: Pt requiring increased time throughout session and fatigues quickly. Benefiting from increased encouragement for participation   General Comments  SpO2 dropping to 84% on 2L during LB dressing. Elevating to 3L and SpO2 >90% untill transfer. Dropped to 84%. encouraged purse lip breathing and SpO2 returning to 91% on 3L. RR 17-23. HR 70-80s    Exercises     Shoulder Instructions      Home Living Family/patient expects to be discharged to:: Private residence Living Arrangements: Children Available Help at Discharge: Family Type of Home: House Home Access: Stairs to enter Technical brewer of Steps: 1   Home Layout: One level     Bathroom Shower/Tub: Occupational psychologist: Standard Bathroom Accessibility: Yes              Prior Functioning/Environment Level of Independence: Independent        Comments: Reports she performed BADLs and light IADLs. Daughter does the driving        OT Problem List: Decreased strength;Decreased range of motion;Decreased activity tolerance;Impaired balance (sitting and/or standing);Decreased knowledge of use of DME or AE;Decreased knowledge of precautions;Cardiopulmonary status limiting activity      OT Treatment/Interventions: Self-care/ADL training;Therapeutic exercise;Energy conservation;DME and/or AE instruction;Therapeutic activities;Patient/family education    OT Goals(Current goals can be found in the care plan section) Acute Rehab OT Goals Patient Stated Goal: "Sleep this off" OT Goal Formulation: With patient Time For Goal Achievement: 03/12/19 Potential to Achieve Goals: Good  OT Frequency: Min 3X/week   Barriers to D/C:            Co-evaluation               AM-PAC OT "6 Clicks" Daily Activity     Outcome Measure Help from another person eating meals?: None Help from another person taking care of personal grooming?: A Little Help from another person toileting, which includes using toliet, bedpan, or urinal?: A Little Help from another person bathing (including washing, rinsing, drying)?: A Lot Help from another person to put on and taking off regular upper body clothing?: A Little Help from another person to put on and taking off regular lower body clothing?: A Lot 6 Click Score: 17   End of Session Equipment Utilized During Treatment: Oxygen(2-3L) Nurse Communication: Mobility status(o2)  Activity Tolerance: Patient limited by fatigue Patient left: in chair;with call bell/phone within reach;with chair alarm set  OT Visit Diagnosis: Unsteadiness on feet (R26.81);Other abnormalities of gait and mobility (R26.89);Muscle weakness (generalized) (M62.81)                Time: [id number] OT Time Calculation (min): 25 min Charges:  OT General Charges $OT Visit: 1 Visit OT Evaluation $OT Eval Moderate Complexity: 1 Mod OT Treatments $Self Care/Home Management : 8-22 mins  Barry Culverhouse MSOT, OTR/L Acute Rehab Pager: [phone] Office: [place] 02/26/2019, 4:42 PM

## 2019-02-26 NOTE — Progress Notes (Signed)
Patient O2 demands increased from 2L to 4 L this AM, and now she is requiring 6L [place] to stay above 88. No SOB. MD notified. Will continue to monitor

## 2019-02-26 NOTE — Progress Notes (Signed)
Gave daughter an update this AM.

## 2019-02-26 NOTE — Progress Notes (Addendum)
PROGRESS NOTE    ISSA Robertson  [id number] DOB: 30-Oct-1945 DOA: 02/23/2019 PCP: Alroy Dust, L.Marlou Sa, MD   Brief Narrative:  [name] is a 73 y.o. female with history of diabetes mellitus type 2, hypertension, hyperlipidemia presents to the ER because of nausea vomiting and diarrhea ongoing for last 1 week.  Patient states she has been intensely fatigued.  Denies any shortness of breath chest pain.  Denies any blood in the vomit or diarrhea denies any recent use of antibiotics.  Denies any recent sick contacts or travel or specifically no contact with COVID-19 infections.  ED Course: In the ER patient was febrile tachypneic and lab work showed acute renal failure with creatinine of 1.3 leukopenia of 3.9 platelets 187 IMA globin COVID-19 test is pending chest x-ray unremarkable.  Patient was started on IV fluids potassium replacement and admitted for SIRS with no clear source.  Assessment & Plan:   Principal Problem:   SIRS (systemic inflammatory response syndrome) (HCC) Active Problems:   Uncontrolled type 2 diabetes mellitus with hyperglycemia (HCC)   Essential hypertension   ARF (acute renal failure) (HCC)   Nausea vomiting and diarrhea   1. COVID 19 Viral Infection  Nausea  Vomiting  Diarrhea;  1. O2 needs increasing today -> currently on 6 L [place] 2. Follow repeat CXR in AM - pending 3. Daily labs - CBC/CMP/D dimer/CRP/ferritin - pending CRP and ferritin at this time 4. I/O, daily weights 5. Prone as able  6. Elevated BNP, give lasix x1 and follow 7. Given worsening O2 requirement -> will discuss possibility of plasma and actemra with pt if indicated 8. Follow GI sx, w/u further as indicated 9. Discussed convalescent plasma with pt and daughter.  Pt deferred to her daughter as Media planner.  Went through consent with daughter on 11/8.  She's agreeable to this if needed.  COVID-19 Labs  Recent Labs    02/25/19 0423 02/26/19 0446  DDIMER  --  0.51*  FERRITIN 111  --    CRP 10.0*  --     Lab Results  Component Value Date   SARSCOV2NAA POSITIVE (A) 02/24/2019    2. Acute renal failure likely from nausea vomiting and diarrhea 1. Creatinine peaked at 1.57 2. Improved today 3. UA wnl   3. Hypomagnesemia: replace and follow  4. Hyperlipidemia on statins.  5. T2DM: start lantus, mealtime insulin, and SSI - follow A1c (7.3) 1. Added tradjenta with evidence of possible benefit in COVID 19, discussed with pt 2. BG reasonable, follow for need for adjustment  6. Hypertension holding hydrochlorothiazide and lisinopril due to renal failure.  Improved, continue to monitor.  Consider restarting tomorrow.  7. Leukopenia: follow CBC with diff, mild neutropenia - improved  DVT prophylaxis: lveonx Code Status: full Family Communication: none at bedside - discussed with daudhger Disposition Plan: pending further improvemetn  Consultants:   none  Procedures:   none  Antimicrobials: Anti-infectives (From admission, onward)   Start     Dose/Rate Route Frequency Ordered Stop   02/26/19 1600  remdesivir 100 mg in sodium chloride 0.9 % 250 mL IVPB     100 mg 500 mL/hr over 30 Minutes Intravenous Every 24 hours 02/25/19 1335 03/02/19 1559   02/25/19 1600  remdesivir 200 mg in sodium chloride 0.9 % 250 mL IVPB     200 mg 500 mL/hr over 30 Minutes Intravenous Once 02/25/19 1335 02/25/19 1812      Subjective: Feels poorly today SOB, no CP Feels nauseous  Objective: Vitals:   02/25/19 1924 02/26/19 0500 02/26/19 0744 02/26/19 1156  BP: (!) 128/57 (!) 133/56 (!) 150/61 (!) 128/57  Pulse: 73  79 83  Resp: 19  17 18   Temp: 98.5 F (36.9 C) 98.4 F (36.9 C) 99.8 F (37.7 C) 99.4 F (37.4 C)  TempSrc: Oral Oral Oral Oral  SpO2: 97%  90% 90%  Weight:      Height:        Intake/Output Summary (Last 24 hours) at 02/26/2019 1327 Last data filed at 02/26/2019 1258 Gross per 24 hour  Intake -  Output 1000 ml  Net -1000 ml   Filed Weights    02/23/19 2153  Weight: 97.5 kg    Examination:  General: No acute distress. Cardiovascular: RRR Lungs: unlabored Abdomen: Soft, nontender, nondistended Neurological: Alert and oriented 3. Moves all extremities 4. Cranial nerves II through XII grossly intact. Skin: Warm and dry. No rashes or lesions. Extremities: No clubbing or cyanosis. No edema.  Data Reviewed: I have personally reviewed following labs and imaging studies  CBC: Recent Labs  Lab 02/23/19 2156 02/24/19 0423 02/25/19 0423 02/26/19 0446  WBC 3.9* 3.8* 1.7* 5.1  NEUTROABS 2.9  --  1.2* 4.2  HGB 11.0* 11.0* 10.0* 10.3*  HCT 33.7* 34.1* 31.9* 31.8*  MCV 90.3 91.4 93.0 89.3  PLT 187 187 202 [id number]   Basic Metabolic Panel: Recent Labs  Lab 02/23/19 2156 02/24/19 0423 02/25/19 0423 02/26/19 0446  NA 135 136 138  138 136  K 3.3* 3.5 4.1  4.1 3.6  CL 99 99 104  103 103  CO2 25 25 23  22 25   GLUCOSE 214* 146* 239*  233* 192*  BUN 30* 32* 29*  29* 28*  CREATININE 1.35* 1.57* 1.08*  1.09* 0.88  CALCIUM 8.4* 8.5* 8.1*  8.1* 8.5*  MG  --   --  1.7 1.5*  PHOS  --   --  3.7  3.6  --    GFR: Estimated Creatinine Clearance: 60.5 mL/min (by C-G formula based on SCr of 0.88 mg/dL). Liver Function Tests: Recent Labs  Lab 02/23/19 2156 02/25/19 0423 02/26/19 0446  AST 24  --  39  ALT 19  --  26  ALKPHOS 40  --  37*  BILITOT 0.6  --  0.5  PROT 6.5  --  6.0*  ALBUMIN 3.3* 3.0*  2.9* 2.7*   No results for input(s): LIPASE, AMYLASE in the last 168 hours. No results for input(s): AMMONIA in the last 168 hours. Coagulation Profile: No results for input(s): INR, PROTIME in the last 168 hours. Cardiac Enzymes: No results for input(s): CKTOTAL, CKMB, CKMBINDEX, TROPONINI in the last 168 hours. BNP (last 3 results) No results for input(s): PROBNP in the last 8760 hours. HbA1C: Recent Labs    02/25/19 1922  HGBA1C 7.3*   CBG: Recent Labs  Lab 02/25/19 1118 02/25/19 1627 02/25/19 2222 02/26/19  0739 02/26/19 1220  GLUCAP 327* 283* 324* 139* 108*   Lipid Profile: No results for input(s): CHOL, HDL, LDLCALC, TRIG, CHOLHDL, LDLDIRECT in the last 72 hours. Thyroid Function Tests: No results for input(s): TSH, T4TOTAL, FREET4, T3FREE, THYROIDAB in the last 72 hours. Anemia Panel: Recent Labs    02/25/19 0423  FERRITIN 111   Sepsis Labs: Recent Labs  Lab 02/23/19 2156  LATICACIDVEN 1.3    Recent Results (from the past 240 hour(s))  SARS CORONAVIRUS 2 (TAT 6-24 HRS) Nasopharyngeal Nasopharyngeal Swab     Status: Abnormal  Collection Time: 02/24/19 12:28 AM   Specimen: Nasopharyngeal Swab  Result Value Ref Range Status   SARS Coronavirus 2 POSITIVE (A) NEGATIVE Final    Comment: RESULT CALLED TO, READ BACK BY AND VERIFIED WITH: Royetta Crochet RN 15:15 02/24/19 ([email or web address]) (NOTE) SARS-CoV-2 target nucleic acids are DETECTED. The SARS-CoV-2 RNA is generally detectable in upper and lower respiratory specimens during the acute phase of infection. Positive results are indicative of active infection with SARS-CoV-2. Clinical  correlation with patient history and other diagnostic information is necessary to determine patient infection status. Positive results do  not rule out bacterial infection or co-infection with other viruses. The expected result is Negative. Fact Sheet for Patients: SugarRoll.be Fact Sheet for Healthcare Providers: https://www.woods-mathews.com/ This test is not yet approved or cleared by the [place] FDA and  has been authorized for detection and/or diagnosis of SARS-CoV-2 by FDA under an Emergency Use Authorization (EUA). This EUA will remain  in effect (meaning this test can be used) fo r the duration of the COVID-19 declaration under Section 564(b)(1) of the Act, 21 U.S.C. section 360bbb-3(b)(1), unless the authorization is terminated or revoked sooner. Performed at [facility], [place] [address] [place], [place] [address]          Radiology Studies: No results found.      Scheduled Meds: . enoxaparin (LOVENOX) injection  40 mg Subcutaneous Q24H  . furosemide  40 mg Intravenous Once  . insulin aspart  0-20 Units Subcutaneous TID WC  . insulin aspart  0-5 Units Subcutaneous QHS  . insulin aspart  3 Units Subcutaneous TID WC  . insulin glargine  10 Units Subcutaneous QHS  . linagliptin  5 mg Oral Daily  . methylPREDNISolone (SOLU-MEDROL) injection  40 mg Intravenous BID  . rosuvastatin  20 mg Oral Daily  . sertraline  50 mg Oral Daily  . timolol  1 drop Both Eyes Daily  . vitamin C  500 mg Oral Daily   Continuous Infusions: . remdesivir 100 mg in NS 250 mL       LOS: 2 days    Time spent: over 30 min    Fayrene Helper, MD [facility] Pager AMION  If 7PM-7AM, please contact night-coverage [email or web address] Password [email or web address] 02/26/2019, 1:27 PM

## 2019-02-27 LAB — COMPREHENSIVE METABOLIC PANEL
ALT: 21 U/L (ref 0–44)
AST: 24 U/L (ref 15–41)
Albumin: 2.9 g/dL — ABNORMAL LOW (ref 3.5–5.0)
Alkaline Phosphatase: 37 U/L — ABNORMAL LOW (ref 38–126)
Anion gap: 11 (ref 5–15)
BUN: 33 mg/dL — ABNORMAL HIGH (ref 8–23)
CO2: 26 mmol/L (ref 22–32)
Calcium: 8.2 mg/dL — ABNORMAL LOW (ref 8.9–10.3)
Chloride: 98 mmol/L (ref 98–111)
Creatinine, Ser: 1.08 mg/dL — ABNORMAL HIGH (ref 0.44–1.00)
GFR calc Af Amer: 59 mL/min — ABNORMAL LOW (ref >60)
GFR calc non Af Amer: 51 mL/min — ABNORMAL LOW (ref >60)
Glucose, Bld: 257 mg/dL — ABNORMAL HIGH (ref 70–99)
Potassium: 4.1 mmol/L (ref 3.5–5.1)
Sodium: 135 mmol/L (ref 135–145)
Total Bilirubin: 0.5 mg/dL (ref 0.3–1.2)
Total Protein: 6 g/dL — ABNORMAL LOW (ref 6.5–8.1)

## 2019-02-27 LAB — CBC WITH DIFFERENTIAL/PLATELET
Abs Immature Granulocytes: 0.02 10*3/uL (ref 0.00–0.07)
Basophils Absolute: 0 10*3/uL (ref 0.0–0.1)
Basophils Relative: 0 %
Eosinophils Absolute: 0 10*3/uL (ref 0.0–0.5)
Eosinophils Relative: 0 %
HCT: 32.7 % — ABNORMAL LOW (ref 36.0–46.0)
Hemoglobin: 10.5 g/dL — ABNORMAL LOW (ref 12.0–15.0)
Immature Granulocytes: 1 %
Lymphocytes Relative: 14 %
Lymphs Abs: 0.5 10*3/uL — ABNORMAL LOW (ref 0.7–4.0)
MCH: 28.9 pg (ref 26.0–34.0)
MCHC: 32.1 g/dL (ref 30.0–36.0)
MCV: 90.1 fL (ref 80.0–100.0)
Monocytes Absolute: 0.2 10*3/uL (ref 0.1–1.0)
Monocytes Relative: 5 %
Neutro Abs: 2.5 10*3/uL (ref 1.7–7.7)
Neutrophils Relative %: 80 %
Platelets: 261 10*3/uL (ref 150–400)
RBC: 3.63 MIL/uL — ABNORMAL LOW (ref 3.87–5.11)
RDW: 13.6 % (ref 11.5–15.5)
WBC: 3.1 10*3/uL — ABNORMAL LOW (ref 4.0–10.5)
nRBC: 0 % (ref 0.0–0.2)

## 2019-02-27 LAB — C-REACTIVE PROTEIN: CRP: 7.2 mg/dL — ABNORMAL HIGH (ref <1.0)

## 2019-02-27 LAB — BRAIN NATRIURETIC PEPTIDE: B Natriuretic Peptide: 115.9 pg/mL — ABNORMAL HIGH (ref 0.0–100.0)

## 2019-02-27 LAB — FERRITIN: Ferritin: 119 ng/mL (ref 11–307)

## 2019-02-27 LAB — MAGNESIUM: Magnesium: 2 mg/dL (ref 1.7–2.4)

## 2019-02-27 LAB — GLUCOSE, CAPILLARY
Glucose-Capillary: 244 mg/dL — ABNORMAL HIGH (ref 70–99)
Glucose-Capillary: 298 mg/dL — ABNORMAL HIGH (ref 70–99)
Glucose-Capillary: 276 mg/dL — ABNORMAL HIGH (ref 70–99)
Glucose-Capillary: 231 mg/dL — ABNORMAL HIGH (ref 70–99)

## 2019-02-27 LAB — D-DIMER, QUANTITATIVE: D-Dimer, Quant: 0.65 ug/mL-FEU — ABNORMAL HIGH (ref 0.00–0.50)

## 2019-02-27 MED ORDER — FUROSEMIDE 10 MG/ML IJ SOLN
40.0000 mg | Freq: Once | INTRAMUSCULAR | Status: AC
  Administered 2019-02-27: 17:00:00 40 mg via INTRAVENOUS
  Filled 2019-02-27: qty 4

## 2019-02-27 MED ORDER — INSULIN GLARGINE 100 UNIT/ML ~~LOC~~ SOLN
15.0000 [IU] | Freq: Every day | SUBCUTANEOUS | Status: DC
  Administered 2019-02-27 – 2019-02-28 (×2): 15 [IU] via SUBCUTANEOUS
  Filled 2019-02-27 (×2): qty 0.15

## 2019-02-27 NOTE — Plan of Care (Signed)
  Problem: Elimination: Goal: Will not experience complications related to bowel motility Outcome: Progressing Goal: Will not experience complications related to urinary retention Outcome: Progressing   

## 2019-02-27 NOTE — Progress Notes (Addendum)
Physical Therapy Treatment Patient Details Name: [name] MRN: [id number] DOB: Aug 24, 1945 Today's Date: 02/27/2019    History of Present Illness 73 y/o female w/ hx of mental disorder, macular degeneration, HTN, HLD, galucoma, DM, depression, presented to ED w/ c/o fever N/V/D and gen weakness. Admitted iwth SIRS adn ARF, 11/6 COVID test found +    PT Comments    Patient with improved cognition, however remains resistant to using RW despite imbalance. Was able to persuade her to use RW on return walk from bathroom and she was much more steady. Continues to desaturate to mid-80s on 3-4 L of O2 via [place]. Requires education re: pursed lip breathing and use of IS and flutter valve.     Follow Up Recommendations  Home health PT;Supervision/Assistance - 24 hour(if pt doesn't have 24/7 assistance/supervision, needs SNF)     Equipment Recommendations  Rolling walker with 5" wheels    Recommendations for Other Services       Precautions / Restrictions Precautions Precautions: Fall Restrictions Weight Bearing Restrictions: No    Mobility  Bed Mobility                  Transfers Overall transfer level: Needs assistance Equipment used: None Transfers: Sit to/from Stand Sit to Stand: Min assist         General transfer comment: Min A for power up into standing; Pt with slight posterior lean  Ambulation/Gait Ambulation/Gait assistance: Min assist Gait Distance (Feet): 20 Feet(x2) Assistive device: None Gait Pattern/deviations: Step-through pattern;Step-to pattern;Decreased stride length;Wide base of support     General Gait Details: refused use of RW x 20 ft, then used on return 20 ft; reaching for counter or walls; staggering LOB posteriorly x1 with min assist to recover.   Stairs             Wheelchair Mobility    Modified Rankin (Stroke Patients Only)       Balance Overall balance assessment: Needs assistance Sitting-balance support: Feet  unsupported;Bilateral upper extremity supported;No upper extremity supported Sitting balance-Leahy Scale: Fair     Standing balance support: No upper extremity supported;During functional activity Standing balance-Leahy Scale: Poor Standing balance comment: Reliant on Min A for maintaining balance                            Cognition Arousal/Alertness: Awake/alert Behavior During Therapy: WFL for tasks assessed/performed Overall Cognitive Status: Within Functional Limits for tasks assessed                                        Exercises Other Exercises Other Exercises: initiated use of flutter valve able to complete x 5 reps with cues Other Exercises: initiated use of IS, w/ cues able to complete x 5 reps pulling 772ml    General Comments General comments (skin integrity, edema, etc.): Educated on use of pursed lip breathing with good return demonstration and quick increase in SaO2      Pertinent Vitals/Pain Pain Assessment: Faces Faces Pain Scale: Hurts even more Pain Location: bil calves Pain Descriptors / Indicators: Cramping Pain Intervention(s): Limited activity within patient's tolerance;Monitored during session    Home Living                      Prior Function  PT Goals (current goals can now be found in the care plan section) Acute Rehab PT Goals Patient Stated Goal: get stronger Time For Goal Achievement: 03/11/19 Potential to Achieve Goals: Fair Progress towards PT goals: Progressing toward goals    Frequency    Min 3X/week      PT Plan Current plan remains appropriate    Co-evaluation              AM-PAC PT "6 Clicks" Mobility   Outcome Measure  Help needed turning from your back to your side while in a flat bed without using bedrails?: A Little Help needed moving from lying on your back to sitting on the side of a flat bed without using bedrails?: A Little Help needed moving to and from a  bed to a chair (including a wheelchair)?: A Little Help needed standing up from a chair using your arms (e.g., wheelchair or bedside chair)?: A Little Help needed to walk in hospital room?: A Little Help needed climbing 3-5 steps with a railing? : A Lot 6 Click Score: 17    End of Session Equipment Utilized During Treatment: Oxygen Activity Tolerance: Treatment limited secondary to medical complications (Comment) Patient left: in chair;with call bell/phone within reach;with chair alarm set   PT Visit Diagnosis: Unsteadiness on feet (R26.81);Muscle weakness (generalized) (M62.81)     Time: 1511-1610 PT Time Calculation (min) (ACUTE ONLY): 59 min  Charges:  $Gait Training: 8-22 mins $Therapeutic Exercise: 23-37 mins                      Barry Brunner, PT     Rexanne Mano 02/27/2019, 5:36 PM

## 2019-02-27 NOTE — Progress Notes (Signed)
PROGRESS NOTE    CHRISTANA TOLENTINO  [id number] DOB: 1945/09/24 DOA: 02/23/2019 PCP: Alroy Dust, L.Marlou Sa, MD   Brief Narrative:  JALESHIA AWADA is Teagen Bucio 73 y.o. female with history of diabetes mellitus type 2, hypertension, hyperlipidemia presents to the ER because of nausea vomiting and diarrhea ongoing for last 1 week.  Patient states she has been intensely fatigued.  Denies any shortness of breath chest pain.  Denies any blood in the vomit or diarrhea denies any recent use of antibiotics.  Denies any recent sick contacts or travel or specifically no contact with COVID-19 infections.  ED Course: In the ER patient was febrile tachypneic and lab work showed acute renal failure with creatinine of 1.3 leukopenia of 3.9 platelets 187 IMA globin COVID-19 test is pending chest x-ray unremarkable.  Patient was started on IV fluids potassium replacement and admitted for SIRS with no clear source.  Assessment & Plan:   Principal Problem:   SIRS (systemic inflammatory response syndrome) (HCC) Active Problems:   Uncontrolled type 2 diabetes mellitus with hyperglycemia (HCC)   Essential hypertension   ARF (acute renal failure) (HCC)   Nausea vomiting and diarrhea   1. COVID 19 Viral Infection  Nausea  Vomiting  Diarrhea;  1. O2 needs increasing today -> currently on 3 L [place] 2. CXR 11/8 with increasing bilateral mid lower lung heterogenous opacities 3. Daily labs - CBC/CMP/D dimer/CRP/ferritin - relatively stable, follow 4. I/O, daily weights 5. Prone as able  6. Elevated BNP, lasix given 11/8 - will give another dose 11/9 7. Given worsening O2 requirement -> will discuss possibility of plasma and actemra with pt if indicated 8. Follow GI sx, w/u further as indicated 9. Discussed convalescent plasma with pt and daughter.  Pt deferred to her daughter as Media planner.  Went through consent with daughter on 11/8.  She's agreeable to this if needed.  COVID-19 Labs  Recent Labs    02/25/19 0423  02/26/19 0446 02/27/19 0228  DDIMER  --  0.51* 0.65*  FERRITIN 111 126 119  CRP 10.0* 4.4* 7.2*    Lab Results  Component Value Date   SARSCOV2NAA POSITIVE (Lynsi Dooner) 02/24/2019    2. Acute renal failure likely from nausea vomiting and diarrhea 1. Creatinine peaked at 1.57 2. Improved today 3. UA wnl   3. Hypomagnesemia: replace and follow  4. Hyperlipidemia on statins.  5. T2DM: start lantus, mealtime insulin, and SSI - follow A1c (7.3) 1. Added tradjenta with evidence of possible benefit in COVID 19, discussed with pt 2. BG high today, increase basal  6. Hypertension holding hydrochlorothiazide and lisinopril due to renal failure.  Improved, continue to monitor.  Consider restarting tomorrow.  7. Leukopenia: follow CBC with diff, mild neutropenia - improved  DVT prophylaxis: lveonx Code Status: full Family Communication: none at bedside - discussed with daudhger Disposition Plan: pending further improvemetn  Consultants:   none  Procedures:   none  Antimicrobials: Anti-infectives (From admission, onward)   Start     Dose/Rate Route Frequency Ordered Stop   02/26/19 1600  remdesivir 100 mg in sodium chloride 0.9 % 250 mL IVPB     100 mg 500 mL/hr over 30 Minutes Intravenous Every 24 hours 02/25/19 1335 03/02/19 1559   02/25/19 1600  remdesivir 200 mg in sodium chloride 0.9 % 250 mL IVPB     200 mg 500 mL/hr over 30 Minutes Intravenous Once 02/25/19 1335 02/25/19 1812      Subjective: Got better sleep last night Doing ok  Objective: Vitals:   02/26/19 1920 02/26/19 2130 02/27/19 0410 02/27/19 0800  BP:  123/69 (!) 114/59   Pulse: 70 72 66   Resp: 18 18 16    Temp:  99 F (37.2 C) 98.4 F (36.9 C) 97.8 F (36.6 C)  TempSrc:   Oral   SpO2: 95% 91% 91%   Weight:      Height:        Intake/Output Summary (Last 24 hours) at 02/27/2019 1618 Last data filed at 02/27/2019 0400 Gross per 24 hour  Intake 0 ml  Output 375 ml  Net -375 ml   Filed Weights    02/23/19 2153  Weight: 97.5 kg    Examination:  General: No acute distress. Cardiovascular: RRR Lungs: unlabored Abdomen: Soft, nontender, nondistended Neurological: Alert and oriented 3. Moves all extremities 4. Cranial nerves II through XII grossly intact. Skin: Warm and dry. No rashes or lesions. Extremities: No clubbing or cyanosis. No edema.   Data Reviewed: I have personally reviewed following labs and imaging studies  CBC: Recent Labs  Lab 02/23/19 2156 02/24/19 0423 02/25/19 0423 02/26/19 0446 02/27/19 0228  WBC 3.9* 3.8* 1.7* 5.1 3.1*  NEUTROABS 2.9  --  1.2* 4.2 2.5  HGB 11.0* 11.0* 10.0* 10.3* 10.5*  HCT 33.7* 34.1* 31.9* 31.8* 32.7*  MCV 90.3 91.4 93.0 89.3 90.1  PLT 187 187 202 229 0000000   Basic Metabolic Panel: Recent Labs  Lab 02/23/19 2156 02/24/19 0423 02/25/19 0423 02/26/19 0446 02/27/19 0228  NA 135 136 138  138 136 135  K 3.3* 3.5 4.1  4.1 3.6 4.1  CL 99 99 104  103 103 98  CO2 25 25 23  22 25 26   GLUCOSE 214* 146* 239*  233* 192* 257*  BUN 30* 32* 29*  29* 28* 33*  CREATININE 1.35* 1.57* 1.08*  1.09* 0.88 1.08*  CALCIUM 8.4* 8.5* 8.1*  8.1* 8.5* 8.2*  MG  --   --  1.7 1.5* 2.0  PHOS  --   --  3.7  3.6  --   --    GFR: Estimated Creatinine Clearance: 49.3 mL/min (Cheris Tweten) (by C-G formula based on SCr of 1.08 mg/dL (H)). Liver Function Tests: Recent Labs  Lab 02/23/19 2156 02/25/19 0423 02/26/19 0446 02/27/19 0228  AST 24  --  39 24  ALT 19  --  26 21  ALKPHOS 40  --  37* 37*  BILITOT 0.6  --  0.5 0.5  PROT 6.5  --  6.0* 6.0*  ALBUMIN 3.3* 3.0*  2.9* 2.7* 2.9*   No results for input(s): LIPASE, AMYLASE in the last 168 hours. No results for input(s): AMMONIA in the last 168 hours. Coagulation Profile: No results for input(s): INR, PROTIME in the last 168 hours. Cardiac Enzymes: No results for input(s): CKTOTAL, CKMB, CKMBINDEX, TROPONINI in the last 168 hours. BNP (last 3 results) No results for input(s): PROBNP in the  last 8760 hours. HbA1C: Recent Labs    02/25/19 1922  HGBA1C 7.3*   CBG: Recent Labs  Lab 02/26/19 1220 02/26/19 1621 02/26/19 2128 02/27/19 0833 02/27/19 1159  GLUCAP 108* 208* 270* 244* 298*   Lipid Profile: No results for input(s): CHOL, HDL, LDLCALC, TRIG, CHOLHDL, LDLDIRECT in the last 72 hours. Thyroid Function Tests: No results for input(s): TSH, T4TOTAL, FREET4, T3FREE, THYROIDAB in the last 72 hours. Anemia Panel: Recent Labs    02/26/19 0446 02/27/19 0228  FERRITIN 126 119   Sepsis Labs: Recent Labs  Lab 02/23/19 2156  LATICACIDVEN 1.3    Recent Results (from the past 240 hour(s))  SARS CORONAVIRUS 2 (TAT 6-24 HRS) Nasopharyngeal Nasopharyngeal Swab     Status: Abnormal   Collection Time: 02/24/19 12:28 AM   Specimen: Nasopharyngeal Swab  Result Value Ref Range Status   SARS Coronavirus 2 POSITIVE (Satoru Milich) NEGATIVE Final    Comment: RESULT CALLED TO, READ BACK BY AND VERIFIED WITH: Royetta Crochet RN 15:15 02/24/19 ([email or web address]) (NOTE) SARS-CoV-2 target nucleic acids are DETECTED. The SARS-CoV-2 RNA is generally detectable in upper and lower respiratory specimens during the acute phase of infection. Positive results are indicative of active infection with SARS-CoV-2. Clinical  correlation with patient history and other diagnostic information is necessary to determine patient infection status. Positive results do  not rule out bacterial infection or co-infection with other viruses. The expected result is Negative. Fact Sheet for Patients: SugarRoll.be Fact Sheet for Healthcare Providers: https://www.woods-mathews.com/ This test is not yet approved or cleared by the [place] FDA and  has been authorized for detection and/or diagnosis of SARS-CoV-2 by FDA under an Emergency Use Authorization (EUA). This EUA will remain  in effect (meaning this test can be used) fo r the duration of the COVID-19 declaration under  Section 564(b)(1) of the Act, 21 U.S.C. section 360bbb-3(b)(1), unless the authorization is terminated or revoked sooner. Performed at [facility], [place] [address], [place], [place] [address]          Radiology Studies: Dg Chest Port 1 View  Result Date: 02/26/2019 CLINICAL DATA:  Hypoxia EXAM: PORTABLE CHEST 1 VIEW COMPARISON:  Chest radiograph 02/23/2019 FINDINGS: Monitoring leads overlie the patient. Stable cardiomegaly. Interval increase in bilateral mid lower lung heterogeneous opacities. No pleural effusion or pneumothorax. Thoracic spine degenerative changes. IMPRESSION: Cardiomegaly. Increasing bilateral mid lower lung heterogeneous opacities which may represent edema, pneumonia or atypical viral infection. Electronically Signed   By: Lovey Newcomer M.D.   On: 02/26/2019 13:57        Scheduled Meds: . enoxaparin (LOVENOX) injection  40 mg Subcutaneous Q24H  . insulin aspart  0-20 Units Subcutaneous TID WC  . insulin aspart  0-5 Units Subcutaneous QHS  . insulin aspart  3 Units Subcutaneous TID WC  . insulin glargine  10 Units Subcutaneous QHS  . linagliptin  5 mg Oral Daily  . methylPREDNISolone (SOLU-MEDROL) injection  40 mg Intravenous BID  . rosuvastatin  20 mg Oral Daily  . sertraline  50 mg Oral Daily  . timolol  1 drop Both Eyes Daily  . traZODone  50 mg Oral QHS  . vitamin C  500 mg Oral Daily   Continuous Infusions: . remdesivir 100 mg in NS 250 mL 100 mg (02/26/19 1701)     LOS: 3 days    Time spent: over 30 min    Fayrene Helper, MD [facility] Pager AMION  If 7PM-7AM, please contact night-coverage [email or web address] Password [email or web address] 02/27/2019, 4:18 PM

## 2019-02-27 NOTE — TOC Initial Note (Signed)
Transition of Care [facility]) - Initial/Assessment Note    Patient Details  Name: [name] MRN: [id number] Date of Birth: 1946/04/06  [facility] CM/SW Contact:    Weston Anna, LCSW Phone Number: 02/27/2019, 3:25 PM  Clinical Narrative:                  CSW spoke with patients daughter, [name], regarding discharge plans- prefers [place] [facility] for [facility] services. Referral sent to [facility] and accepted- requested orders to be placed by MD. Daughter voiced concerns with patient returning home alone and questioned if PT would continue working with patient throughout her duration at the hospital. [place] will stay in touch with daughter to continue discharge planning services.   Expected Discharge Plan: [place] Barriers to Discharge: Continued Medical Work up   Patient Goals and CMS Choice     Choice offered to / list presented to : Spouse  Expected Discharge Plan and Services Expected Discharge Plan: [place] In-house Referral: Clinical Social Work   Post Acute Care Choice: [place] arrangements for the past 2 months: Single Family Home                 DME Arranged: N/A DME Agency: NA       HH Arranged: PT, OT HH Agency: [facility]        Prior Living Arrangements/Services Living arrangements for the past 2 months: Single Family Home Lives with:: Spouse   Do you feel safe going back to the place where you live?: Yes               Activities of Daily Living Home Assistive Devices/Equipment: None ADL Screening (condition at time of admission) Patient's cognitive ability adequate to safely complete daily activities?: Yes Is the patient deaf or have difficulty hearing?: No Does the patient have difficulty seeing, even when wearing glasses/contacts?: No Does the patient have difficulty concentrating, remembering, or making decisions?: No Patient able to express need for assistance with ADLs?:  Yes Does the patient have difficulty dressing or bathing?: No Independently performs ADLs?: Yes (appropriate for developmental age) Does the patient have difficulty walking or climbing stairs?: No Weakness of Legs: Both Weakness of Arms/Hands: None  Permission Sought/Granted                  Emotional Assessment           Psych Involvement: No (comment)  Admission diagnosis:  Fever, unspecified fever cause [R50.9] Nausea and vomiting, intractability of vomiting not specified, unspecified vomiting type [R11.2] Suspected COVID-19 virus infection [Z20.828] SIRS (systemic inflammatory response syndrome) ([place]) [R65.10] Patient Active Problem List   Diagnosis Date Noted  . SIRS (systemic inflammatory response syndrome) ([place]) 02/24/2019  . ARF (acute renal failure) ([place]) 02/24/2019  . Nausea vomiting and diarrhea 02/24/2019  . Sepsis ([place]) 09/17/2016  . Acute lower UTI 09/17/2016  . Uncontrolled type 2 diabetes mellitus with hyperglycemia ([place]) 09/17/2016  . Essential hypertension 09/17/2016   PCP:  Alroy Dust, L.Marlou Sa, MD Pharmacy:   [id number] - [place], [place] - [address] [place] [address] [place] [address] Phone: [phone] Fax: [phone]     Social Determinants of Health (SDOH) Interventions    Readmission Risk Interventions No flowsheet data found.

## 2019-02-28 LAB — COMPREHENSIVE METABOLIC PANEL
ALT: 21 U/L (ref 0–44)
AST: 24 U/L (ref 15–41)
Albumin: 3 g/dL — ABNORMAL LOW (ref 3.5–5.0)
Alkaline Phosphatase: 40 U/L (ref 38–126)
Anion gap: 13 (ref 5–15)
BUN: 37 mg/dL — ABNORMAL HIGH (ref 8–23)
CO2: 26 mmol/L (ref 22–32)
Calcium: 8.5 mg/dL — ABNORMAL LOW (ref 8.9–10.3)
Chloride: 97 mmol/L — ABNORMAL LOW (ref 98–111)
Creatinine, Ser: 1.05 mg/dL — ABNORMAL HIGH (ref 0.44–1.00)
GFR calc Af Amer: >60 mL/min (ref >60)
GFR calc non Af Amer: 53 mL/min — ABNORMAL LOW (ref >60)
Glucose, Bld: 160 mg/dL — ABNORMAL HIGH (ref 70–99)
Potassium: 3.5 mmol/L (ref 3.5–5.1)
Sodium: 136 mmol/L (ref 135–145)
Total Bilirubin: 0.6 mg/dL (ref 0.3–1.2)
Total Protein: 6.2 g/dL — ABNORMAL LOW (ref 6.5–8.1)

## 2019-02-28 LAB — CBC WITH DIFFERENTIAL/PLATELET
Abs Immature Granulocytes: 0.03 10*3/uL (ref 0.00–0.07)
Basophils Absolute: 0 10*3/uL (ref 0.0–0.1)
Basophils Relative: 0 %
Eosinophils Absolute: 0 10*3/uL (ref 0.0–0.5)
Eosinophils Relative: 0 %
HCT: 35.4 % — ABNORMAL LOW (ref 36.0–46.0)
Hemoglobin: 11.7 g/dL — ABNORMAL LOW (ref 12.0–15.0)
Immature Granulocytes: 1 %
Lymphocytes Relative: 11 %
Lymphs Abs: 0.6 10*3/uL — ABNORMAL LOW (ref 0.7–4.0)
MCH: 29.4 pg (ref 26.0–34.0)
MCHC: 33.1 g/dL (ref 30.0–36.0)
MCV: 88.9 fL (ref 80.0–100.0)
Monocytes Absolute: 0.5 10*3/uL (ref 0.1–1.0)
Monocytes Relative: 9 %
Neutro Abs: 4.4 10*3/uL (ref 1.7–7.7)
Neutrophils Relative %: 79 %
Platelets: 344 10*3/uL (ref 150–400)
RBC: 3.98 MIL/uL (ref 3.87–5.11)
RDW: 13.5 % (ref 11.5–15.5)
WBC: 5.6 10*3/uL (ref 4.0–10.5)
nRBC: 0 % (ref 0.0–0.2)

## 2019-02-28 LAB — GLUCOSE, CAPILLARY
Glucose-Capillary: 110 mg/dL — ABNORMAL HIGH (ref 70–99)
Glucose-Capillary: 130 mg/dL — ABNORMAL HIGH (ref 70–99)
Glucose-Capillary: 262 mg/dL — ABNORMAL HIGH (ref 70–99)
Glucose-Capillary: 288 mg/dL — ABNORMAL HIGH (ref 70–99)

## 2019-02-28 LAB — C-REACTIVE PROTEIN: CRP: 5 mg/dL — ABNORMAL HIGH (ref <1.0)

## 2019-02-28 LAB — MAGNESIUM: Magnesium: 1.7 mg/dL (ref 1.7–2.4)

## 2019-02-28 LAB — BRAIN NATRIURETIC PEPTIDE: B Natriuretic Peptide: 35 pg/mL (ref 0.0–100.0)

## 2019-02-28 LAB — D-DIMER, QUANTITATIVE: D-Dimer, Quant: 0.69 ug/mL-FEU — ABNORMAL HIGH (ref 0.00–0.50)

## 2019-02-28 MED ORDER — POTASSIUM CHLORIDE CRYS ER 20 MEQ PO TBCR
40.0000 meq | EXTENDED_RELEASE_TABLET | Freq: Once | ORAL | Status: AC
  Administered 2019-02-28: 17:00:00 40 meq via ORAL
  Filled 2019-02-28: qty 2

## 2019-02-28 MED ORDER — TRAZODONE HCL 50 MG PO TABS
100.0000 mg | ORAL_TABLET | Freq: Every day | ORAL | Status: DC
  Administered 2019-02-28 – 2019-03-05 (×6): 100 mg via ORAL
  Filled 2019-02-28 (×6): qty 2

## 2019-02-28 MED ORDER — DEXAMETHASONE SODIUM PHOSPHATE 10 MG/ML IJ SOLN
6.0000 mg | INTRAMUSCULAR | Status: DC
  Administered 2019-03-01 – 2019-03-03 (×3): 6 mg via INTRAVENOUS
  Filled 2019-02-28 (×3): qty 1

## 2019-02-28 MED ORDER — ENOXAPARIN SODIUM 60 MG/0.6ML ~~LOC~~ SOLN
50.0000 mg | SUBCUTANEOUS | Status: DC
  Administered 2019-03-01 – 2019-03-06 (×6): 50 mg via SUBCUTANEOUS
  Filled 2019-02-28 (×6): qty 0.6

## 2019-02-28 NOTE — Plan of Care (Signed)
  Problem: Elimination: Goal: Will not experience complications related to bowel motility Outcome: Progressing Goal: Will not experience complications related to urinary retention Outcome: Progressing   

## 2019-02-28 NOTE — Progress Notes (Signed)
Occupational Therapy Treatment Patient Details Name: [name] MRN: [id number] DOB: 1945/04/26 Today's Date: 02/28/2019    History of present illness 73 y/o female w/ hx of mental disorder, macular degeneration, HTN, HLD, galucoma, DM, depression, presented to ED w/ c/o fever N/V/D and gen weakness. Admitted iwth SIRS adn ARF, 11/6 COVID test found +   OT comments  Pt progressing towards established OT goals. Pt performing oral care while standing at sink with Min Guard A; SpO2 dropping to 85% on 3L. Cued for purse lip breathing. Pt requiring Min Guard A and RW for functional mobility. Requesting to return to bed. Continue to recommend dc to home with HHOT and will continue to follow acutely as admitted.    Follow Up Recommendations  Home health OT;Supervision/Assistance - 24 hour    Equipment Recommendations  3 in 1 bedside commode    Recommendations for Other Services PT consult    Precautions / Restrictions Precautions Precautions: Fall Restrictions Weight Bearing Restrictions: No       Mobility Bed Mobility Overal bed mobility: Needs Assistance Bed Mobility: Sit to Supine       Sit to supine: Min guard   General bed mobility comments: Min Guard A for safety  Transfers Overall transfer level: Needs assistance Equipment used: Rolling walker (2 wheeled) Transfers: Sit to/from Stand Sit to Stand: Min guard         General transfer comment: Min Guard A for safety    Balance Overall balance assessment: Needs assistance Sitting-balance support: Feet unsupported;Bilateral upper extremity supported;No upper extremity supported Sitting balance-Leahy Scale: Fair     Standing balance support: No upper extremity supported;During functional activity Standing balance-Leahy Scale: Fair Standing balance comment: Able to maintain static standing without UE support while at sink                           ADL either performed or assessed with clinical  judgement   ADL Overall ADL's : Needs assistance/impaired     Grooming: Set up;Supervision/safety;Oral care;Standing Grooming Details (indicate cue type and reason): Pt performing oral care at sink with Supervision for safety                 Toilet Transfer: Min guard;Ambulation;RW(simulated to recliner)           Functional mobility during ADLs: Min guard;Rolling walker(Short distance) General ADL Comments: Pt performing short distance mobility to sink (in room) for oral care and then to bed. Pt performing at [place] level. Discussed further  need for further mobility at next session and pt stating "ill try my best."     Vision       Perception     Praxis      Cognition Arousal/Alertness: Awake/alert Behavior During Therapy: WFL for tasks assessed/performed Overall Cognitive Status: No family/caregiver present to determine baseline cognitive functioning                                 General Comments: Pt irritable this session and stating "I am feeling ill (frustrated)!" Quickly switching emotions, one moments short with therapist and next laughing. Unsure if this is baseline for pt.         Exercises     Shoulder Instructions       General Comments SpO2 dropping to 85% on 3L O2 during oral care. HR 80-90s. RR 20s.  Pertinent Vitals/ Pain       Pain Assessment: Faces Faces Pain Scale: No hurt Pain Intervention(s): Monitored during session  Home Living                                          Prior Functioning/Environment              Frequency  Min 3X/week        Progress Toward Goals  OT Goals(current goals can now be found in the care plan section)  Progress towards OT goals: Progressing toward goals  Acute Rehab OT Goals Patient Stated Goal: get stronger OT Goal Formulation: With patient Time For Goal Achievement: 03/12/19 Potential to Achieve Goals: Good ADL Goals Pt Will Perform Grooming:  standing;with min guard assist Pt Will Perform Upper Body Dressing: with supervision;with set-up;sitting Pt Will Perform Lower Body Dressing: with min guard assist;sit to/from stand Pt Will Transfer to Toilet: with min guard assist;ambulating;bedside commode Pt Will Perform Toileting - Clothing Manipulation and hygiene: with min guard assist;sit to/from stand;sitting/lateral leans  Plan Discharge plan remains appropriate    Co-evaluation                 AM-PAC OT "6 Clicks" Daily Activity     Outcome Measure   Help from another person eating meals?: None Help from another person taking care of personal grooming?: A Little Help from another person toileting, which includes using toliet, bedpan, or urinal?: A Little Help from another person bathing (including washing, rinsing, drying)?: A Lot Help from another person to put on and taking off regular upper body clothing?: A Little Help from another person to put on and taking off regular lower body clothing?: A Lot 6 Click Score: 17    End of Session Equipment Utilized During Treatment: Oxygen(3L)  OT Visit Diagnosis: Unsteadiness on feet (R26.81);Other abnormalities of gait and mobility (R26.89);Muscle weakness (generalized) (M62.81)   Activity Tolerance Patient limited by fatigue   Patient Left in bed;with call bell/phone within reach   Nurse Communication Mobility status(o2)        Time: [id number] OT Time Calculation (min): 20 min  Charges: OT General Charges $OT Visit: 1 Visit OT Treatments $Self Care/Home Management : 8-22 mins  [place], OTR/L Acute Rehab Pager: [phone] Office: [place] 02/28/2019, 3:59 PM

## 2019-02-28 NOTE — Progress Notes (Signed)
Ok to increase her Lovenox to 0.5mg /kg/day due to BMI 42 per Dr. Florene Glen.  Onnie Boer, PharmD, BCIDP, AAHIVP, CPP Infectious Disease Pharmacist 02/28/2019 1:49 PM

## 2019-02-28 NOTE — Progress Notes (Addendum)
PROGRESS NOTE    [name]  [id number] DOB: 1945/05/05 DOA: 02/23/2019 PCP: Alroy Dust, L.Marlou Sa, MD   Brief Narrative:  [name] is [name] 72 y.o. female with history of diabetes mellitus type 2, hypertension, hyperlipidemia presents to the ER because of nausea vomiting and diarrhea ongoing for last 1 week.  Patient states she has been intensely fatigued.  Denies any shortness of breath chest pain.  Denies any blood in the vomit or diarrhea denies any recent use of antibiotics.  Denies any recent sick contacts or travel or specifically no contact with COVID-19 infections.  ED Course: In the ER patient was febrile tachypneic and lab work showed acute renal failure with creatinine of 1.3 leukopenia of 3.9 platelets 187 IMA globin COVID-19 test is pending chest x-ray unremarkable.  Patient was started on IV fluids potassium replacement and admitted for SIRS with no clear source.  She was admitted for COVID 19 pneumonia.  She's being treated with steroids and remdesivir.  Continues to require 2-3 L [place].    Assessment & Plan:   Principal Problem:   SIRS (systemic inflammatory response syndrome) (HCC) Active Problems:   Uncontrolled type 2 diabetes mellitus with hyperglycemia (HCC)   Essential hypertension   ARF (acute renal failure) (HCC)   Nausea vomiting and diarrhea   1. COVID 19 Viral Infection  Nausea  Vomiting  Diarrhea;  1. O2 needs increasing today -> currently on 2-3 L [place] - on low 80's on RA 2. CXR 11/8 with increasing bilateral mid lower lung heterogenous opacities 3. Daily labs - CBC/CMP/D dimer/CRP/ferritin - relatively stable, follow 4. I/O, daily weights 5. Prone as able  6. Steroids, remdesivir (day 4) 7. Elevated BNP, lasix given 11/8 and 11/9 8. Follow GI sx, w/u further as indicated 9. Discussed convalescent plasma with pt and daughter.  Pt deferred to her daughter as Media planner.  Went through consent with daughter on 11/8.  She's agreeable to this if needed.   COVID-19 Labs  Recent Labs    02/26/19 0446 02/27/19 0228 02/28/19 0157  DDIMER 0.51* 0.65* 0.69*  FERRITIN 126 119  --   CRP 4.4* 7.2* 5.0*    Lab Results  Component Value Date   SARSCOV2NAA POSITIVE (Xianna Siverling) 02/24/2019    2. Acute renal failure likely from nausea vomiting and diarrhea 1. Creatinine peaked at 1.57 2. Improved today 3. UA wnl   3. Hypomagnesemia: replace and follow   4. Hyperlipidemia on statins.  5. T2DM: start lantus, mealtime insulin, and SSI - follow A1c (7.3) 1. Added tradjenta with evidence of possible benefit in COVID 19, discussed with pt 2. Continue to monitor   6. Hypertension holding hydrochlorothiazide and lisinopril due to renal failure.  Improved, continue to monitor.  Consider restarting tomorrow.  7. Leukopenia: follow CBC with diff, mild neutropenia - improved  DVT prophylaxis: lveonx Code Status: full Family Communication: none at bedside - discussed with daudhger Disposition Plan: pending further improvemetn  Consultants:   none  Procedures:   none  Antimicrobials: Anti-infectives (From admission, onward)   Start     Dose/Rate Route Frequency Ordered Stop   02/26/19 1600  remdesivir 100 mg in sodium chloride 0.9 % 250 mL IVPB     100 mg 500 mL/hr over 30 Minutes Intravenous Every 24 hours 02/25/19 1335 03/02/19 1559   02/25/19 1600  remdesivir 200 mg in sodium chloride 0.9 % 250 mL IVPB     200 mg 500 mL/hr over 30 Minutes Intravenous Once 02/25/19 1335  02/25/19 1812      Subjective: Didn't get as good sleep last night Doesn't feel as well today  Objective: Vitals:   02/27/19 2031 02/28/19 0435 02/28/19 0730 02/28/19 1605  BP: 129/65 130/62 113/68 (!) 107/53  Pulse: 70 66 74 69  Resp: 19 17 14 19   Temp: 98.5 F (36.9 C) 98.2 F (36.8 C) 98.6 F (37 C) 98.3 F (36.8 C)  TempSrc: Oral Oral Oral Oral  SpO2: 90% 93% 90% 90%  Weight:      Height:        Intake/Output Summary (Last 24 hours) at 02/28/2019  1611 Last data filed at 02/28/2019 1429 Gross per 24 hour  Intake 850 ml  Output 1560 ml  Net -710 ml   Filed Weights   02/23/19 2153  Weight: 97.5 kg    Examination:  General: No acute distress. Cardiovascular: RRR Lungs: unlabored  Abdomen: Soft, nontender, nondistended Neurological: Alert and oriented 3. Moves all extremities 4. Cranial nerves II through XII grossly intact. Skin: Warm and dry. No rashes or lesions. Extremities: No clubbing or cyanosis. No edema.   Data Reviewed: I have personally reviewed following labs and imaging studies  CBC: Recent Labs  Lab 02/23/19 2156 02/24/19 0423 02/25/19 0423 02/26/19 0446 02/27/19 0228 02/28/19 0157  WBC 3.9* 3.8* 1.7* 5.1 3.1* 5.6  NEUTROABS 2.9  --  1.2* 4.2 2.5 4.4  HGB 11.0* 11.0* 10.0* 10.3* 10.5* 11.7*  HCT 33.7* 34.1* 31.9* 31.8* 32.7* 35.4*  MCV 90.3 91.4 93.0 89.3 90.1 88.9  PLT 187 187 202 229 261 [id number]   Basic Metabolic Panel: Recent Labs  Lab 02/24/19 0423 02/25/19 0423 02/26/19 0446 02/27/19 0228 02/28/19 0157  NA 136 138  138 136 135 136  K 3.5 4.1  4.1 3.6 4.1 3.5  CL 99 104  103 103 98 97*  CO2 25 23  22 25 26 26   GLUCOSE 146* 239*  233* 192* 257* 160*  BUN 32* 29*  29* 28* 33* 37*  CREATININE 1.57* 1.08*  1.09* 0.88 1.08* 1.05*  CALCIUM 8.5* 8.1*  8.1* 8.5* 8.2* 8.5*  MG  --  1.7 1.5* 2.0 1.7  PHOS  --  3.7  3.6  --   --   --    GFR: Estimated Creatinine Clearance: 50.7 mL/min (Thomson Herbers) (by C-G formula based on SCr of 1.05 mg/dL (H)). Liver Function Tests: Recent Labs  Lab 02/23/19 2156 02/25/19 0423 02/26/19 0446 02/27/19 0228 02/28/19 0157  AST 24  --  39 24 24  ALT 19  --  26 21 21   ALKPHOS 40  --  37* 37* 40  BILITOT 0.6  --  0.5 0.5 0.6  PROT 6.5  --  6.0* 6.0* 6.2*  ALBUMIN 3.3* 3.0*  2.9* 2.7* 2.9* 3.0*   No results for input(s): LIPASE, AMYLASE in the last 168 hours. No results for input(s): AMMONIA in the last 168 hours. Coagulation Profile: No results for  input(s): INR, PROTIME in the last 168 hours. Cardiac Enzymes: No results for input(s): CKTOTAL, CKMB, CKMBINDEX, TROPONINI in the last 168 hours. BNP (last 3 results) No results for input(s): PROBNP in the last 8760 hours. HbA1C: Recent Labs    02/25/19 1922  HGBA1C 7.3*   CBG: Recent Labs  Lab 02/27/19 1159 02/27/19 1627 02/27/19 2030 02/28/19 0729 02/28/19 1136  GLUCAP 298* 276* 231* 110* 130*   Lipid Profile: No results for input(s): CHOL, HDL, LDLCALC, TRIG, CHOLHDL, LDLDIRECT in the last 72 hours. Thyroid Function Tests:  No results for input(s): TSH, T4TOTAL, FREET4, T3FREE, THYROIDAB in the last 72 hours. Anemia Panel: Recent Labs    02/26/19 0446 02/27/19 0228  FERRITIN 126 119   Sepsis Labs: Recent Labs  Lab 02/23/19 2156  LATICACIDVEN 1.3    Recent Results (from the past 240 hour(s))  SARS CORONAVIRUS 2 (TAT 6-24 HRS) Nasopharyngeal Nasopharyngeal Swab     Status: Abnormal   Collection Time: 02/24/19 12:28 AM   Specimen: Nasopharyngeal Swab  Result Value Ref Range Status   SARS Coronavirus 2 POSITIVE (Shanea Karney) NEGATIVE Final    Comment: RESULT CALLED TO, READ BACK BY AND VERIFIED WITH: Royetta Crochet RN 15:15 02/24/19 ([email or web address]) (NOTE) SARS-CoV-2 target nucleic acids are DETECTED. The SARS-CoV-2 RNA is generally detectable in upper and lower respiratory specimens during the acute phase of infection. Positive results are indicative of active infection with SARS-CoV-2. Clinical  correlation with patient history and other diagnostic information is necessary to determine patient infection status. Positive results do  not rule out bacterial infection or co-infection with other viruses. The expected result is Negative. Fact Sheet for Patients: SugarRoll.be Fact Sheet for Healthcare Providers: https://www.woods-mathews.com/ This test is not yet approved or cleared by the [place] FDA and  has been authorized for  detection and/or diagnosis of SARS-CoV-2 by FDA under an Emergency Use Authorization (EUA). This EUA will remain  in effect (meaning this test can be used) fo r the duration of the COVID-19 declaration under Section 564(b)(1) of the Act, 21 U.S.C. section 360bbb-3(b)(1), unless the authorization is terminated or revoked sooner. Performed at [facility], [place] [address] [place], [place] [address]          Radiology Studies: No results found.      Scheduled Meds: . [START ON 03/01/2019] enoxaparin (LOVENOX) injection  50 mg Subcutaneous Q24H  . insulin aspart  0-20 Units Subcutaneous TID WC  . insulin aspart  0-5 Units Subcutaneous QHS  . insulin aspart  3 Units Subcutaneous TID WC  . insulin glargine  15 Units Subcutaneous QHS  . linagliptin  5 mg Oral Daily  . methylPREDNISolone (SOLU-MEDROL) injection  40 mg Intravenous BID  . rosuvastatin  20 mg Oral Daily  . sertraline  50 mg Oral Daily  . timolol  1 drop Both Eyes Daily  . traZODone  100 mg Oral QHS  . vitamin C  500 mg Oral Daily   Continuous Infusions: . remdesivir 100 mg in NS 250 mL 100 mg (02/27/19 1647)     LOS: 4 days    Time spent: over 30 min    Fayrene Helper, MD [facility] Pager AMION  If 7PM-7AM, please contact night-coverage [email or web address] Password [email or web address] 02/28/2019, 4:11 PM

## 2019-03-01 DIAGNOSIS — E1165 Type 2 diabetes mellitus with hyperglycemia: Secondary | ICD-10-CM

## 2019-03-01 DIAGNOSIS — J1289 Other viral pneumonia: Secondary | ICD-10-CM

## 2019-03-01 DIAGNOSIS — I10 Essential (primary) hypertension: Secondary | ICD-10-CM

## 2019-03-01 DIAGNOSIS — U071 COVID-19: Principal | ICD-10-CM

## 2019-03-01 DIAGNOSIS — J9601 Acute respiratory failure with hypoxia: Secondary | ICD-10-CM

## 2019-03-01 LAB — CBC WITH DIFFERENTIAL/PLATELET
Abs Immature Granulocytes: 0.04 10*3/uL (ref 0.00–0.07)
Basophils Absolute: 0 10*3/uL (ref 0.0–0.1)
Basophils Relative: 0 %
Eosinophils Absolute: 0 10*3/uL (ref 0.0–0.5)
Eosinophils Relative: 0 %
HCT: 34.2 % — ABNORMAL LOW (ref 36.0–46.0)
Hemoglobin: 11.3 g/dL — ABNORMAL LOW (ref 12.0–15.0)
Immature Granulocytes: 1 %
Lymphocytes Relative: 8 %
Lymphs Abs: 0.4 10*3/uL — ABNORMAL LOW (ref 0.7–4.0)
MCH: 29.4 pg (ref 26.0–34.0)
MCHC: 33 g/dL (ref 30.0–36.0)
MCV: 88.8 fL (ref 80.0–100.0)
Monocytes Absolute: 0.4 10*3/uL (ref 0.1–1.0)
Monocytes Relative: 8 %
Neutro Abs: 4.3 10*3/uL (ref 1.7–7.7)
Neutrophils Relative %: 83 %
Platelets: 360 10*3/uL (ref 150–400)
RBC: 3.85 MIL/uL — ABNORMAL LOW (ref 3.87–5.11)
RDW: 13.7 % (ref 11.5–15.5)
WBC: 5.2 10*3/uL (ref 4.0–10.5)
nRBC: 0 % (ref 0.0–0.2)

## 2019-03-01 LAB — GLUCOSE, CAPILLARY
Glucose-Capillary: 207 mg/dL — ABNORMAL HIGH (ref 70–99)
Glucose-Capillary: 274 mg/dL — ABNORMAL HIGH (ref 70–99)
Glucose-Capillary: 285 mg/dL — ABNORMAL HIGH (ref 70–99)

## 2019-03-01 LAB — COMPREHENSIVE METABOLIC PANEL
ALT: 20 U/L (ref 0–44)
AST: 19 U/L (ref 15–41)
Albumin: 2.8 g/dL — ABNORMAL LOW (ref 3.5–5.0)
Alkaline Phosphatase: 41 U/L (ref 38–126)
Anion gap: 10 (ref 5–15)
BUN: 35 mg/dL — ABNORMAL HIGH (ref 8–23)
CO2: 24 mmol/L (ref 22–32)
Calcium: 8.5 mg/dL — ABNORMAL LOW (ref 8.9–10.3)
Chloride: 101 mmol/L (ref 98–111)
Creatinine, Ser: 0.99 mg/dL (ref 0.44–1.00)
GFR calc Af Amer: >60 mL/min (ref >60)
GFR calc non Af Amer: 57 mL/min — ABNORMAL LOW (ref >60)
Glucose, Bld: 207 mg/dL — ABNORMAL HIGH (ref 70–99)
Potassium: 4.5 mmol/L (ref 3.5–5.1)
Sodium: 135 mmol/L (ref 135–145)
Total Bilirubin: 0.5 mg/dL (ref 0.3–1.2)
Total Protein: 6 g/dL — ABNORMAL LOW (ref 6.5–8.1)

## 2019-03-01 LAB — FERRITIN: Ferritin: 114 ng/mL (ref 11–307)

## 2019-03-01 LAB — BRAIN NATRIURETIC PEPTIDE: B Natriuretic Peptide: 30.4 pg/mL (ref 0.0–100.0)

## 2019-03-01 LAB — C-REACTIVE PROTEIN: CRP: 5.9 mg/dL — ABNORMAL HIGH (ref <1.0)

## 2019-03-01 LAB — MAGNESIUM: Magnesium: 1.8 mg/dL (ref 1.7–2.4)

## 2019-03-01 LAB — D-DIMER, QUANTITATIVE: D-Dimer, Quant: 0.54 ug/mL-FEU — ABNORMAL HIGH (ref 0.00–0.50)

## 2019-03-01 MED ORDER — FAMOTIDINE 20 MG PO TABS
20.0000 mg | ORAL_TABLET | Freq: Every day | ORAL | Status: DC
  Administered 2019-03-01 – 2019-03-06 (×6): 20 mg via ORAL
  Filled 2019-03-01 (×6): qty 1

## 2019-03-01 MED ORDER — FUROSEMIDE 10 MG/ML IJ SOLN
40.0000 mg | Freq: Once | INTRAMUSCULAR | Status: AC
  Administered 2019-03-01: 13:00:00 40 mg via INTRAVENOUS
  Filled 2019-03-01: qty 4

## 2019-03-01 MED ORDER — INSULIN GLARGINE 100 UNIT/ML ~~LOC~~ SOLN
15.0000 [IU] | Freq: Two times a day (BID) | SUBCUTANEOUS | Status: DC
  Administered 2019-03-01 (×2): 15 [IU] via SUBCUTANEOUS
  Filled 2019-03-01 (×3): qty 0.15

## 2019-03-01 MED ORDER — SODIUM CHLORIDE 0.9% IV SOLUTION
Freq: Once | INTRAVENOUS | Status: AC
  Administered 2019-03-01: 17:00:00 via INTRAVENOUS

## 2019-03-01 MED ORDER — ZINC SULFATE 220 (50 ZN) MG PO CAPS
220.0000 mg | ORAL_CAPSULE | Freq: Every day | ORAL | Status: DC
  Administered 2019-03-01 – 2019-03-06 (×6): 220 mg via ORAL
  Filled 2019-03-01 (×6): qty 1

## 2019-03-01 NOTE — Plan of Care (Signed)
  Problem: Skin Integrity: Goal: Risk for impaired skin integrity will decrease Outcome: Progressing   

## 2019-03-01 NOTE — Progress Notes (Signed)
PROGRESS NOTE  [name] [id number] DOB: [birth year] DOA: 02/23/2019  PCP: Alroy Dust, L.Marlou Sa, MD  Brief History/Interval Summary: 73 year old female with a past medical history of diabetes mellitus type 2, essential hypertension, hyperlipidemia presented with nausea vomiting diarrhea ongoing for a week.  She was noted to be febrile.  Found to be positive for COVID-19.  Hospitalized for further management due to oxygen requirements.  Reason for Visit: Pneumonia due to COVID-19.  Acute respiratory failure with hypoxia  Consultants: None  Procedures: None  Antibiotics: Anti-infectives (From admission, onward)   Start     Dose/Rate Route Frequency Ordered Stop   02/26/19 1600  remdesivir 100 mg in sodium chloride 0.9 % 250 mL IVPB     100 mg 500 mL/hr over 30 Minutes Intravenous Every 24 hours 02/25/19 1335 03/02/19 1559   02/25/19 1600  remdesivir 200 mg in sodium chloride 0.9 % 250 mL IVPB     200 mg 500 mL/hr over 30 Minutes Intravenous Once 02/25/19 1335 02/25/19 1812      Subjective/Interval History: Patient not very communicative.  She states that she is a bit short of breath with exertion.  Denies any nausea vomiting.  No abdominal pain.  No chest pain.  Occasional dry cough.    Assessment/Plan:  Acute Hypoxic Resp. Failure/Pneumonia due to COVID-19  COVID-19 Labs  Recent Labs    02/27/19 0228 02/28/19 0157 03/01/19 0045  DDIMER 0.65* 0.69* 0.54*  FERRITIN 119  --  114  CRP 7.2* 5.0* 5.9*    Lab Results  Component Value Date   SARSCOV2NAA POSITIVE (A) 02/24/2019     Fever: Noted to be afebrile Oxygen requirements: On 3 to 4 L of oxygen by nasal cannula.  Saturating in the late 80s. Antibacterials: None Remdesivir: Day 5 today Steroids: Dexamethasone 6 mg daily Diuretics: Not on scheduled diuretics Actemra: Did not receive Vitamin C and Zinc: Continue DVT Prophylaxis:  Lovenox 55 mg every 24 hours  Patient's respiratory status remains tenuous.   Oxygen requirements have been climbing over the last 48 hours.  She is currently on 4 L of oxygen.  She is noted to have lower extremity edema.  Might benefit from Lasix. She was given Lasix on 11/8 and 11/9. She will complete course of remdesivir today.  She remains on steroids.  She was consented for convalescent plasma yesterday by Dr. Florene Glen.  He discussed extensively with patient's daughter.  We will order her plasma today.  Continue incentive spirometry, prone positioning as much as possible and mobilization.  Her CRP is 5.9 today.  D-dimer 0.54.  Acute renal failure Creatinine peaked at 1.57.  Has been improving.  The urine output.  Hypomagnesemia This was repleted.  Hyperlipidemia On statins.  Diabetes mellitus type 2, uncontrolled with hyperglycemia HbA1c 7.3.  Elevated CBG most likely due to steroids.  Will increase dose of Lantus.  Patient was started on Tradjenta.  Essential hypertension HCTZ and lisinopril placed on hold due to renal failure.  Blood pressure is reasonably well controlled.  Leukopenia Due to COVID-19.  Now resolved.  Normocytic anemia No evidence of overt bleeding.  Continue to monitor.  Morbid obesity Estimated body mass index is 41.99 kg/m as calculated from the following:   Height as of this encounter: 5' (1.524 m).   Weight as of this encounter: 97.5 kg.   DVT Prophylaxis: Lovenox PUD Prophylaxis: Initiate Pepcid Code Status: Full code Family Communication: We will discuss with daughter Disposition Plan: Mobilize.   Medications:  Scheduled: .  dexamethasone (DECADRON) injection  6 mg Intravenous Q24H  . enoxaparin (LOVENOX) injection  50 mg Subcutaneous Q24H  . insulin aspart  0-20 Units Subcutaneous TID WC  . insulin aspart  0-5 Units Subcutaneous QHS  . insulin aspart  3 Units Subcutaneous TID WC  . insulin glargine  15 Units Subcutaneous QHS  . linagliptin  5 mg Oral Daily  . rosuvastatin  20 mg Oral Daily  . sertraline  50 mg Oral  Daily  . timolol  1 drop Both Eyes Daily  . traZODone  100 mg Oral QHS  . vitamin C  500 mg Oral Daily   Continuous: . remdesivir 100 mg in NS 250 mL 100 mg (02/28/19 1637)   [id number] **OR** ondansetron (ZOFRAN) IV   Objective:  Vital Signs  Vitals:   02/28/19 2230 03/01/19 0405 03/01/19 0635 03/01/19 0729  BP:  121/63  (!) 145/70  Pulse: 68 64  70  Resp: 18 19  12   Temp:  98 F (36.7 C)  98.4 F (36.9 C)  TempSrc:  Oral  Oral  SpO2: 90% 90% 91% (!) 89%  Weight:      Height:        Intake/Output Summary (Last 24 hours) at 03/01/2019 1152 Last data filed at 03/01/2019 1000 Gross per 24 hour  Intake 840 ml  Output 1510 ml  Net -670 ml   Filed Weights   02/23/19 2153  Weight: 97.5 kg    General appearance: Awake alert.  In no distress.  Morbidly obese Resp: Tachypneic at rest.  No use of accessory muscles.  Coarse breath sounds bilaterally with crackles at the bases.  No wheezing or rhonchi. Cardio: S1-S2 is normal regular.  No S3-S4.  No rubs murmurs or bruit GI: Abdomen is soft.  Nontender nondistended.  Bowel sounds are present normal.  No masses organomegaly Extremities: Lower extremity edema noted. Neurologic: Alert and oriented x3.  No focal neurological deficits.    Lab Results:  Data Reviewed: I have personally reviewed following labs and imaging studies  CBC: Recent Labs  Lab 02/25/19 0423 02/26/19 0446 02/27/19 0228 02/28/19 0157 03/01/19 0045  WBC 1.7* 5.1 3.1* 5.6 5.2  NEUTROABS 1.2* 4.2 2.5 4.4 4.3  HGB 10.0* 10.3* 10.5* 11.7* 11.3*  HCT 31.9* 31.8* 32.7* 35.4* 34.2*  MCV 93.0 89.3 90.1 88.9 88.8  PLT 202 229 261 344 [id number]    Basic Metabolic Panel: Recent Labs  Lab 02/25/19 0423 02/26/19 0446 02/27/19 0228 02/28/19 0157 03/01/19 0045  NA 138  138 136 135 136 135  K 4.1  4.1 3.6 4.1 3.5 4.5  CL 104  103 103 98 97* 101  CO2 23  22 25 26 26 24   GLUCOSE 239*  233* 192* 257* 160* 207*  BUN 29*  29* 28* 33* 37* 35*   CREATININE 1.08*  1.09* 0.88 1.08* 1.05* 0.99  CALCIUM 8.1*  8.1* 8.5* 8.2* 8.5* 8.5*  MG 1.7 1.5* 2.0 1.7 1.8  PHOS 3.7  3.6  --   --   --   --     GFR: Estimated Creatinine Clearance: 53.8 mL/min (by C-G formula based on SCr of 0.99 mg/dL).  Liver Function Tests: Recent Labs  Lab 02/23/19 2156 02/25/19 0423 02/26/19 0446 02/27/19 0228 02/28/19 0157 03/01/19 0045  AST 24  --  39 24 24 19   ALT 19  --  26 21 21 20   ALKPHOS 40  --  37* 37* 40 41  BILITOT 0.6  --  0.5  0.5 0.6 0.5  PROT 6.5  --  6.0* 6.0* 6.2* 6.0*  ALBUMIN 3.3* 3.0*  2.9* 2.7* 2.9* 3.0* 2.8*     CBG: Recent Labs  Lab 02/28/19 1136 02/28/19 1559 02/28/19 2017 03/01/19 0727 03/01/19 1129  GLUCAP 130* 262* 288* 207* 274*     Anemia Panel: Recent Labs    02/27/19 0228 03/01/19 0045  FERRITIN 119 114    Recent Results (from the past 240 hour(s))  SARS CORONAVIRUS 2 (TAT 6-24 HRS) Nasopharyngeal Nasopharyngeal Swab     Status: Abnormal   Collection Time: 02/24/19 12:28 AM   Specimen: Nasopharyngeal Swab  Result Value Ref Range Status   SARS Coronavirus 2 POSITIVE (A) NEGATIVE Final    Comment: RESULT CALLED TO, READ BACK BY AND VERIFIED WITH: Royetta Crochet RN 15:15 02/24/19 ([email or web address]) (NOTE) SARS-CoV-2 target nucleic acids are DETECTED. The SARS-CoV-2 RNA is generally detectable in upper and lower respiratory specimens during the acute phase of infection. Positive results are indicative of active infection with SARS-CoV-2. Clinical  correlation with patient history and other diagnostic information is necessary to determine patient infection status. Positive results do  not rule out bacterial infection or co-infection with other viruses. The expected result is Negative. Fact Sheet for Patients: SugarRoll.be Fact Sheet for Healthcare Providers: https://www.woods-mathews.com/ This test is not yet approved or cleared by the [place] FDA and  has  been authorized for detection and/or diagnosis of SARS-CoV-2 by FDA under an Emergency Use Authorization (EUA). This EUA will remain  in effect (meaning this test can be used) fo r the duration of the COVID-19 declaration under Section 564(b)(1) of the Act, 21 U.S.C. section 360bbb-3(b)(1), unless the authorization is terminated or revoked sooner. Performed at [facility], [place] [address] [place], [place] [address]       Radiology Studies: No results found.     LOS: 5 days   Irais Mottram [employer] on [email or web address]  03/01/2019, 11:52 AM

## 2019-03-01 NOTE — Plan of Care (Signed)
  Problem: Clinical Measurements: Goal: Respiratory complications will improve 03/01/2019 0942 by Mariane Baumgarten, RN Outcome: Progressing 03/01/2019 0932 by Mariane Baumgarten, RN Outcome: Progressing Wean off of o2 slowly

## 2019-03-01 NOTE — Progress Notes (Signed)
Physical Therapy Treatment Patient Details Name: [name] MRN: [id number] DOB: February 16, 1946 Today's Date: 03/01/2019    History of Present Illness 73 y/o female w/ hx of mental disorder, macular degeneration, HTN, HLD, galucoma, DM, depression, presented to ED w/ c/o fever N/V/D and gen weakness. Admitted iwth SIRS adn ARF, 11/6 COVID test found +    PT Comments    Patient trying to eat breakfast in bed on arrival with sats 82% on 3L. She was low in the bed with trunk flexed and knees elevated. Irritable and took quite a bit of education and persuading that she needed to stop eating and get repositioned for improved oxygenation. Incr to 4L while assisting pt to get to sit EOB with sats remaining 82-86% on 4L. MD in to assess pt while at EOB. Assisted to [facility] and ultimately to walk 40 ft x 2 with RW and standing rest breaks to allow sats to return to upper 80s/low 90s while on 4L. Pt persuaded to sit upright in her chair with feet supported (not reclined) and re-educated on use of IS. On departure, pt down to 3L with sats 92%. RN made aware of desaturation issues.     Follow Up Recommendations  Home health PT;Supervision/Assistance - 24 hour(if pt doesn't have 24/7 assistance/supervision, needs SNF)     Equipment Recommendations  Rolling walker with 5" wheels    Recommendations for Other Services OT consult     Precautions / Restrictions Precautions Precautions: Fall Restrictions Weight Bearing Restrictions: No    Mobility  Bed Mobility Overal bed mobility: Needs Assistance Bed Mobility: Supine to Sit     Supine to sit: HOB elevated;Min assist     General bed mobility comments: min assist to pivot to EOB after she raised her torso; linens wet with incr difficulty  Transfers Overall transfer level: Needs assistance Equipment used: Rolling walker (2 wheeled) Transfers: Sit to/from [facility] Sit to Stand: Min assist Stand pivot transfers: Min assist        General transfer comment: requires instructional cues for use of RW; assist for anterior wt-shift over her BOS  Ambulation/Gait Ambulation/Gait assistance: Min assist Gait Distance (Feet): 40 Feet(x2 with standing rest) Assistive device: Rolling walker (2 wheeled) Gait Pattern/deviations: Step-through pattern;Step-to pattern;Decreased stride length;Wide base of support     General Gait Details: agreed to use RW if that meant I could stand back and not "be on top of me!" Standing breaks with cues for PLB to incr sats   Stairs             Wheelchair Mobility    Modified Rankin (Stroke Patients Only)       Balance Overall balance assessment: Needs assistance Sitting-balance support: Feet unsupported;No upper extremity supported Sitting balance-Leahy Scale: Fair     Standing balance support: No upper extremity supported;During functional activity Standing balance-Leahy Scale: Fair                              Cognition Arousal/Alertness: Awake/alert Behavior During Therapy: WFL for tasks assessed/performed Overall Cognitive Status: No family/caregiver present to determine baseline cognitive functioning                                 General Comments: Pt again irritable and wants "all this stuff off me!" Reports feeling closed in.       Exercises Other Exercises Other  Exercises: reviewed use of IS, w/ cues able to complete x 5 reps pulling 1000 ml; could not recall appropriate freq to complete    General Comments        Pertinent Vitals/Pain Pain Assessment: Faces Faces Pain Scale: No hurt    Home Living                      Prior Function            PT Goals (current goals can now be found in the care plan section) Acute Rehab PT Goals Patient Stated Goal: get stronger Time For Goal Achievement: 03/11/19 Potential to Achieve Goals: Fair Progress towards PT goals: Progressing toward goals    Frequency     Min 3X/week      PT Plan Current plan remains appropriate    Co-evaluation              AM-PAC PT "6 Clicks" Mobility   Outcome Measure  Help needed turning from your back to your side while in a flat bed without using bedrails?: A Little Help needed moving from lying on your back to sitting on the side of a flat bed without using bedrails?: A Little Help needed moving to and from a bed to a chair (including a wheelchair)?: A Little Help needed standing up from a chair using your arms (e.g., wheelchair or bedside chair)?: A Little Help needed to walk in hospital room?: A Little Help needed climbing 3-5 steps with a railing? : A Lot 6 Click Score: 17    End of Session Equipment Utilized During Treatment: Oxygen Activity Tolerance: Treatment limited secondary to medical complications (Comment) Patient left: in chair;with call bell/phone within reach;with chair alarm set Nurse Communication: Mobility status;Other (comment)(desaturated; c/o bottom hurting and seated upright in chair) PT Visit Diagnosis: Unsteadiness on feet (R26.81);Muscle weakness (generalized) (M62.81)     Time: [id number] PT Time Calculation (min) (ACUTE ONLY): 70 min  Charges:  $Gait Training: 23-37 mins $Therapeutic Activity: 8-22 mins $Self Care/Home Management: 8-22                      Barry Brunner, PT     Rexanne Mano 03/01/2019, 12:56 PM

## 2019-03-02 LAB — BASIC METABOLIC PANEL
Anion gap: 13 (ref 5–15)
BUN: 41 mg/dL — ABNORMAL HIGH (ref 8–23)
CO2: 25 mmol/L (ref 22–32)
Calcium: 8.8 mg/dL — ABNORMAL LOW (ref 8.9–10.3)
Chloride: 101 mmol/L (ref 98–111)
Creatinine, Ser: 1.08 mg/dL — ABNORMAL HIGH (ref 0.44–1.00)
GFR calc Af Amer: 59 mL/min — ABNORMAL LOW (ref >60)
GFR calc non Af Amer: 51 mL/min — ABNORMAL LOW (ref >60)
Glucose, Bld: 99 mg/dL (ref 70–99)
Potassium: 3.4 mmol/L — ABNORMAL LOW (ref 3.5–5.1)
Sodium: 139 mmol/L (ref 135–145)

## 2019-03-02 LAB — PREPARE FRESH FROZEN PLASMA

## 2019-03-02 LAB — BPAM FFP
Blood Product Expiration Date: 202011121504
ISSUE DATE / TIME: 202011111521
Unit Type and Rh: 6200

## 2019-03-02 LAB — CBC
HCT: 32.9 % — ABNORMAL LOW (ref 36.0–46.0)
Hemoglobin: 11 g/dL — ABNORMAL LOW (ref 12.0–15.0)
MCH: 29.6 pg (ref 26.0–34.0)
MCHC: 33.4 g/dL (ref 30.0–36.0)
MCV: 88.4 fL (ref 80.0–100.0)
Platelets: 389 10*3/uL (ref 150–400)
RBC: 3.72 MIL/uL — ABNORMAL LOW (ref 3.87–5.11)
RDW: 13.8 % (ref 11.5–15.5)
WBC: 8.4 10*3/uL (ref 4.0–10.5)
nRBC: 0 % (ref 0.0–0.2)

## 2019-03-02 LAB — GLUCOSE, CAPILLARY
Glucose-Capillary: 63 mg/dL — ABNORMAL LOW (ref 70–99)
Glucose-Capillary: 150 mg/dL — ABNORMAL HIGH (ref 70–99)
Glucose-Capillary: 367 mg/dL — ABNORMAL HIGH (ref 70–99)

## 2019-03-02 LAB — C-REACTIVE PROTEIN: CRP: 3.7 mg/dL — ABNORMAL HIGH (ref <1.0)

## 2019-03-02 MED ORDER — POTASSIUM CHLORIDE CRYS ER 20 MEQ PO TBCR
40.0000 meq | EXTENDED_RELEASE_TABLET | Freq: Once | ORAL | Status: AC
  Administered 2019-03-02: 09:00:00 40 meq via ORAL
  Filled 2019-03-02: qty 2

## 2019-03-02 MED ORDER — FUROSEMIDE 10 MG/ML IJ SOLN
40.0000 mg | Freq: Once | INTRAMUSCULAR | Status: AC
  Administered 2019-03-02: 40 mg via INTRAVENOUS
  Filled 2019-03-02: qty 4

## 2019-03-02 MED ORDER — INSULIN GLARGINE 100 UNIT/ML ~~LOC~~ SOLN
20.0000 [IU] | Freq: Every day | SUBCUTANEOUS | Status: DC
  Administered 2019-03-02 – 2019-03-03 (×2): 20 [IU] via SUBCUTANEOUS
  Filled 2019-03-02 (×2): qty 0.2

## 2019-03-02 MED ORDER — POTASSIUM CHLORIDE CRYS ER 20 MEQ PO TBCR
40.0000 meq | EXTENDED_RELEASE_TABLET | Freq: Once | ORAL | Status: AC
  Administered 2019-03-02: 40 meq via ORAL
  Filled 2019-03-02: qty 2

## 2019-03-02 NOTE — Progress Notes (Signed)
Spoke with [name], patients daughter. Addressed concerns with the way the patient is being cared for.

## 2019-03-02 NOTE — Progress Notes (Signed)
PT Cancellation Note  Patient Details Name: [name] MRN: [id number] DOB: 03/23/1946   Cancelled Treatment:    Reason Eval/Treat Not Completed: Patient declined, no reason specified                                          Pt stated that she was very fatigued and did not wish to attempt any mobility, therapist attempted to encourage and educate on importance of mobility with COVID dx but pt declined.  Horald Chestnut, PT   Delford Field 03/02/2019, 4:26 PM

## 2019-03-02 NOTE — Plan of Care (Signed)
  Problem: Clinical Measurements: Goal: Will remain free from infection Outcome: Progressing   Problem: Clinical Measurements: Goal: Respiratory complications will improve Outcome: Progressing   

## 2019-03-02 NOTE — Progress Notes (Signed)
PROGRESS NOTE  [name] [id number] DOB: 1945-09-11 DOA: 02/23/2019  PCP: Alroy Dust, L.Marlou Sa, MD  Brief History/Interval Summary: 73 year old female with a past medical history of diabetes mellitus type 2, essential hypertension, hyperlipidemia presented with nausea vomiting diarrhea ongoing for a week.  She was noted to be febrile.  Found to be positive for COVID-19.  Hospitalized for further management due to oxygen requirements.  Reason for Visit: Pneumonia due to COVID-19.  Acute respiratory failure with hypoxia  Consultants: None  Procedures: None  Antibiotics: Anti-infectives (From admission, onward)   Start     Dose/Rate Route Frequency Ordered Stop   02/26/19 1600  remdesivir 100 mg in sodium chloride 0.9 % 250 mL IVPB     100 mg 500 mL/hr over 30 Minutes Intravenous Every 24 hours 02/25/19 1335 03/01/19 1601   02/25/19 1600  remdesivir 200 mg in sodium chloride 0.9 % 250 mL IVPB     200 mg 500 mL/hr over 30 Minutes Intravenous Once 02/25/19 1335 02/25/19 1812      Subjective/Interval History: Patient states that she is feeling slightly better today.  Still short of breath with minimal exertion.  Occasional cough with whitish expectoration.  No nausea vomiting.  No chest pain.      Assessment/Plan:  Acute Hypoxic Resp. Failure/Pneumonia due to COVID-19  COVID-19 Labs  Recent Labs    02/28/19 0157 03/01/19 0045 03/02/19 0315  DDIMER 0.69* 0.54*  --   FERRITIN  --  114  --   CRP 5.0* 5.9* 3.7*    Lab Results  Component Value Date   SARSCOV2NAA POSITIVE (A) 02/24/2019     Fever: Afebrile Oxygen requirements: Nasal cannula.  2 L/min.  Saturating in the early 90s.   Antibacterials: None Remdesivir: Completed course on 11/11 Steroids: Dexamethasone 6 mg daily Diuretics: Not on scheduled diuretics Actemra: Did not receive Vitamin C and Zinc: Continue DVT Prophylaxis:  Lovenox 55 mg every 24 hours  Patient's respiratory status seems to be  improving.  Her oxygen quadrants have come down.  She has completed course of remdesivir.  She remains on steroids.  She is also getting daily furosemide.  Additional dose will be ordered today.  She was given convalescent plasma yesterday.  Continue incentive spirometry, prone positioning and mobilization.  CRP improved to 3.7.  D-dimer was 0.54 yesterday.  Acute renal failure Creatinine peaked at 1.57.  Has been stable.  Monitor urine output.  Monitor labs while she is getting diuresed.  Hypokalemia and hypomagnesemia Potassium to be repleted.  Check magnesium level.  Hyperlipidemia On statins.  Diabetes mellitus type 2, uncontrolled with hyperglycemia HbA1c 7.3.  Elevated CBG most likely due to steroids.  Dose of Lantus was increased yesterday.  Noted to have an episode of hypoglycemia this morning.  Patient also on [place].  We will cut back on Lantus.    Essential hypertension HCTZ and lisinopril placed on hold due to renal failure.  Blood pressure is reasonably well controlled.  Leukopenia Due to COVID-19.  Now resolved.  Normocytic anemia No evidence of overt bleeding.  Hemoglobin is stable.  Continue to monitor periodically.  Morbid obesity Estimated body mass index is 41.99 kg/m as calculated from the following:   Height as of this encounter: 5' (1.524 m).   Weight as of this encounter: 97.5 kg.   DVT Prophylaxis: Lovenox PUD Prophylaxis: Pepcid Code Status: Full code Family Communication: Discussed with daughter yesterday Disposition Plan: Continue to mobilize.  Out of bed to chair.  PT has  recommended home health versus skilled nursing facility depending on patient's progression.   Medications:  Scheduled: . dexamethasone (DECADRON) injection  6 mg Intravenous Q24H  . enoxaparin (LOVENOX) injection  50 mg Subcutaneous Q24H  . famotidine  20 mg Oral Daily  . insulin aspart  0-20 Units Subcutaneous TID WC  . insulin aspart  0-5 Units Subcutaneous QHS  . insulin  aspart  3 Units Subcutaneous TID WC  . insulin glargine  15 Units Subcutaneous BID  . linagliptin  5 mg Oral Daily  . rosuvastatin  20 mg Oral Daily  . sertraline  50 mg Oral Daily  . timolol  1 drop Both Eyes Daily  . traZODone  100 mg Oral QHS  . vitamin C  500 mg Oral Daily  . zinc sulfate  220 mg Oral Daily   Continuous:  [id number] **OR** ondansetron (ZOFRAN) IV   Objective:  Vital Signs  Vitals:   03/01/19 1949 03/01/19 2043 03/02/19 0458 03/02/19 0752  BP: 133/63 133/63 134/68 136/63  Pulse: 71 71 70 67  Resp:  19 17 17   Temp:  98.6 F (37 C)  98.3 F (36.8 C)  TempSrc:  Oral  Oral  SpO2: 95% 92% 92% 91%  Weight:      Height:        Intake/Output Summary (Last 24 hours) at 03/02/2019 1042 Last data filed at 03/02/2019 0432 Gross per 24 hour  Intake 689 ml  Output -  Net 689 ml   Filed Weights   02/23/19 2153  Weight: 97.5 kg    General appearance: Awake alert.  In no distress.  Morbidly obese Resp: Mildly tachypneic at rest.  Coarse breath sounds bilaterally.  Crackles at the bases.  No wheezing or rhonchi. Cardio: S1-S2 is normal regular.  No S3-S4.  No rubs murmurs or bruit GI: Abdomen is soft.  Nontender nondistended.  Bowel sounds are present normal.  No masses organomegaly Extremities: Edema noted in the lower extremities Neurologic: Alert and oriented x3.  No focal neurological deficits.     Lab Results:  Data Reviewed: I have personally reviewed following labs and imaging studies  CBC: Recent Labs  Lab 02/25/19 0423 02/26/19 0446 02/27/19 0228 02/28/19 0157 03/01/19 0045 03/02/19 0315  WBC 1.7* 5.1 3.1* 5.6 5.2 8.4  NEUTROABS 1.2* 4.2 2.5 4.4 4.3  --   HGB 10.0* 10.3* 10.5* 11.7* 11.3* 11.0*  HCT 31.9* 31.8* 32.7* 35.4* 34.2* 32.9*  MCV 93.0 89.3 90.1 88.9 88.8 88.4  PLT 202 229 261 344 360 [id number]    Basic Metabolic Panel: Recent Labs  Lab 02/25/19 0423 02/26/19 0446 02/27/19 0228 02/28/19 0157 03/01/19 0045 03/02/19  0315  NA 138  138 136 135 136 135 139  K 4.1  4.1 3.6 4.1 3.5 4.5 3.4*  CL 104  103 103 98 97* 101 101  CO2 23  22 25 26 26 24 25   GLUCOSE 239*  233* 192* 257* 160* 207* 99  BUN 29*  29* 28* 33* 37* 35* 41*  CREATININE 1.08*  1.09* 0.88 1.08* 1.05* 0.99 1.08*  CALCIUM 8.1*  8.1* 8.5* 8.2* 8.5* 8.5* 8.8*  MG 1.7 1.5* 2.0 1.7 1.8  --   PHOS 3.7  3.6  --   --   --   --   --     GFR: Estimated Creatinine Clearance: 49.3 mL/min (A) (by C-G formula based on SCr of 1.08 mg/dL (H)).  Liver Function Tests: Recent Labs  Lab 02/23/19 2156 02/25/19 0423 02/26/19  [id number] 02/27/19 0228 02/28/19 0157 03/01/19 0045  AST 24  --  39 24 24 19   ALT 19  --  26 21 21 20   ALKPHOS 40  --  37* 37* 40 41  BILITOT 0.6  --  0.5 0.5 0.6 0.5  PROT 6.5  --  6.0* 6.0* 6.2* 6.0*  ALBUMIN 3.3* 3.0*  2.9* 2.7* 2.9* 3.0* 2.8*     CBG: Recent Labs  Lab 02/28/19 2017 03/01/19 0727 03/01/19 1129 03/01/19 1649 03/02/19 0833  GLUCAP 288* 207* 274* 285* 63*     Anemia Panel: Recent Labs    03/01/19 0045  FERRITIN 114    Recent Results (from the past 240 hour(s))  SARS CORONAVIRUS 2 (TAT 6-24 HRS) Nasopharyngeal Nasopharyngeal Swab     Status: Abnormal   Collection Time: 02/24/19 12:28 AM   Specimen: Nasopharyngeal Swab  Result Value Ref Range Status   SARS Coronavirus 2 POSITIVE (A) NEGATIVE Final    Comment: RESULT CALLED TO, READ BACK BY AND VERIFIED WITH: Royetta Crochet RN 15:15 02/24/19 ([email or web address]) (NOTE) SARS-CoV-2 target nucleic acids are DETECTED. The SARS-CoV-2 RNA is generally detectable in upper and lower respiratory specimens during the acute phase of infection. Positive results are indicative of active infection with SARS-CoV-2. Clinical  correlation with patient history and other diagnostic information is necessary to determine patient infection status. Positive results do  not rule out bacterial infection or co-infection with other viruses. The expected result is Negative.  Fact Sheet for Patients: SugarRoll.be Fact Sheet for Healthcare Providers: https://www.woods-mathews.com/ This test is not yet approved or cleared by the [place] FDA and  has been authorized for detection and/or diagnosis of SARS-CoV-2 by FDA under an Emergency Use Authorization (EUA). This EUA will remain  in effect (meaning this test can be used) fo r the duration of the COVID-19 declaration under Section 564(b)(1) of the Act, 21 U.S.C. section 360bbb-3(b)(1), unless the authorization is terminated or revoked sooner. Performed at [facility], [place] [address], [place], [place] [address]       Radiology Studies: No results found.     LOS: 6 days   Riyanna Crutchley [employer] on [email or web address]  03/02/2019, 10:42 AM

## 2019-03-03 LAB — BASIC METABOLIC PANEL
Anion gap: 11 (ref 5–15)
BUN: 40 mg/dL — ABNORMAL HIGH (ref 8–23)
CO2: 22 mmol/L (ref 22–32)
Calcium: 8.8 mg/dL — ABNORMAL LOW (ref 8.9–10.3)
Chloride: 102 mmol/L (ref 98–111)
Creatinine, Ser: 1.1 mg/dL — ABNORMAL HIGH (ref 0.44–1.00)
GFR calc Af Amer: 58 mL/min — ABNORMAL LOW (ref >60)
GFR calc non Af Amer: 50 mL/min — ABNORMAL LOW (ref >60)
Glucose, Bld: 184 mg/dL — ABNORMAL HIGH (ref 70–99)
Potassium: 4.1 mmol/L (ref 3.5–5.1)
Sodium: 135 mmol/L (ref 135–145)

## 2019-03-03 LAB — C-REACTIVE PROTEIN: CRP: 6.4 mg/dL — ABNORMAL HIGH (ref <1.0)

## 2019-03-03 LAB — GLUCOSE, CAPILLARY
Glucose-Capillary: 248 mg/dL — ABNORMAL HIGH (ref 70–99)
Glucose-Capillary: 127 mg/dL — ABNORMAL HIGH (ref 70–99)
Glucose-Capillary: 152 mg/dL — ABNORMAL HIGH (ref 70–99)
Glucose-Capillary: 433 mg/dL — ABNORMAL HIGH (ref 70–99)
Glucose-Capillary: 370 mg/dL — ABNORMAL HIGH (ref 70–99)

## 2019-03-03 LAB — MAGNESIUM: Magnesium: 1.9 mg/dL (ref 1.7–2.4)

## 2019-03-03 LAB — GLUCOSE, RANDOM: Glucose, Bld: 421 mg/dL — ABNORMAL HIGH (ref 70–99)

## 2019-03-03 MED ORDER — DEXAMETHASONE SODIUM PHOSPHATE 4 MG/ML IJ SOLN
4.0000 mg | INTRAMUSCULAR | Status: DC
  Administered 2019-03-04 – 2019-03-06 (×3): 4 mg via INTRAVENOUS
  Filled 2019-03-03 (×3): qty 1

## 2019-03-03 MED ORDER — INSULIN ASPART 100 UNIT/ML ~~LOC~~ SOLN
25.0000 [IU] | Freq: Once | SUBCUTANEOUS | Status: AC
  Administered 2019-03-03: 25 [IU] via SUBCUTANEOUS

## 2019-03-03 MED ORDER — FUROSEMIDE 10 MG/ML IJ SOLN
40.0000 mg | Freq: Once | INTRAMUSCULAR | Status: AC
  Administered 2019-03-03: 12:00:00 40 mg via INTRAVENOUS
  Filled 2019-03-03: qty 4

## 2019-03-03 NOTE — Progress Notes (Signed)
PROGRESS NOTE  [name] [id number] DOB: 1945-09-09 DOA: 02/23/2019  PCP: Alroy Dust, L.Marlou Sa, MD  Brief History/Interval Summary: 73 year old female with a past medical history of diabetes mellitus type 2, essential hypertension, hyperlipidemia presented with nausea vomiting diarrhea ongoing for a week.  She was noted to be febrile.  Found to be positive for COVID-19.  Hospitalized for further management due to oxygen requirements.  Reason for Visit: Pneumonia due to COVID-19.  Acute respiratory failure with hypoxia  Consultants: None  Procedures: None  Antibiotics: Anti-infectives (From admission, onward)   Start     Dose/Rate Route Frequency Ordered Stop   02/26/19 1600  remdesivir 100 mg in sodium chloride 0.9 % 250 mL IVPB     100 mg 500 mL/hr over 30 Minutes Intravenous Every 24 hours 02/25/19 1335 03/01/19 1601   02/25/19 1600  remdesivir 200 mg in sodium chloride 0.9 % 250 mL IVPB     200 mg 500 mL/hr over 30 Minutes Intravenous Once 02/25/19 1335 02/25/19 1812      Subjective/Interval History: Patient states that she is feeling better.  Less short of breath.  However she has been noted to get dyspneic even with minimal exertion.  Denies nausea vomiting.  Reasonably good appetite.       Assessment/Plan:  Acute Hypoxic Resp. Failure/Pneumonia due to COVID-19  COVID-19 Labs  Recent Labs    03/01/19 0045 03/02/19 0315 03/03/19 0003  DDIMER 0.54*  --   --   FERRITIN 114  --   --   CRP 5.9* 3.7* 6.4*    Lab Results  Component Value Date   SARSCOV2NAA POSITIVE (A) 02/24/2019     Fever: Remains afebrile Oxygen requirements: Nasal cannula.  2 L/min.  Saturating in the mid 90s.   Antibacterials: None Remdesivir: Completed course on 11/11 Steroids: Dexamethasone 6 mg daily Diuretics: She has been getting furosemide depending on volume status. Actemra: Did not receive Vitamin C and Zinc: Continue DVT Prophylaxis:  Lovenox 55 mg every 24 hours   Patient's respiratory status has been improving.  Her oxygen requirements have decreased.  She is down to 2 L.  She is completed course of remdesivir.  She remains on steroids.  Her inflammatory markers have been improving but noted to be 6.4 today.  Reason for this is not entirely clear.  Clinically she has improved.  She was also given convalescent plasma.  Continue with incentive spirometry, prone positioning and mobilization.  Another dose of Lasix today.  Acute renal failure Creatinine peaked at 1.57.  Continue to monitor labs while she is getting diuresed.  Monitor urine output.  Hypokalemia and hypomagnesemia Potassium repleted.  Magnesium 1.9 today.  Hyperlipidemia On statins.  Diabetes mellitus type 2, uncontrolled with hyperglycemia HbA1c 7.3.  Elevated CBG most likely due to steroids.  Lantus dose being adjusted.  She did have an episode of hypoglycemia yesterday morning.  Patient also on [place].  Continue to monitor.  Should improve as steroid is tapered down.    Essential hypertension HCTZ and lisinopril placed on hold due to renal failure.  Blood pressure is reasonably well controlled.  Leukopenia Due to COVID-19.  Now resolved.  Normocytic anemia No evidence of overt bleeding.  Hemoglobin is stable.  Continue to monitor periodically.  Morbid obesity Estimated body mass index is 41.99 kg/m as calculated from the following:   Height as of this encounter: 5' (1.524 m).   Weight as of this encounter: 97.5 kg.   DVT Prophylaxis: Lovenox PUD Prophylaxis: Pepcid  Code Status: Full code Family Communication: Discussed with patient.  Daughter being updated daily. Disposition Plan: Skilled nursing facility recommended.  Discussed with patient and daughter.  They are both agreeable.  Social worker consulted.     Medications:  Scheduled: . dexamethasone (DECADRON) injection  6 mg Intravenous Q24H  . enoxaparin (LOVENOX) injection  50 mg Subcutaneous Q24H  . famotidine   20 mg Oral Daily  . insulin aspart  0-20 Units Subcutaneous TID WC  . insulin aspart  0-5 Units Subcutaneous QHS  . insulin aspart  3 Units Subcutaneous TID WC  . insulin glargine  20 Units Subcutaneous QHS  . linagliptin  5 mg Oral Daily  . rosuvastatin  20 mg Oral Daily  . sertraline  50 mg Oral Daily  . timolol  1 drop Both Eyes Daily  . traZODone  100 mg Oral QHS  . vitamin C  500 mg Oral Daily  . zinc sulfate  220 mg Oral Daily   Continuous:  [id number] **OR** ondansetron (ZOFRAN) IV   Objective:  Vital Signs  Vitals:   03/02/19 1542 03/02/19 2028 03/03/19 0513 03/03/19 0800  BP: (!) 114/57 138/81 131/73 128/64  Pulse: 81 77 69 74  Resp: 14 17 18 19   Temp: 98.5 F (36.9 C) 98.7 F (37.1 C) 98.4 F (36.9 C) 97.8 F (36.6 C)  TempSrc: Oral Oral Oral Oral  SpO2: 98% 91% 93% 96%  Weight:      Height:        Intake/Output Summary (Last 24 hours) at 03/03/2019 1037 Last data filed at 03/03/2019 0630 Gross per 24 hour  Intake -  Output 900 ml  Net -900 ml   Filed Weights   02/23/19 2153  Weight: 97.5 kg    General appearance: Awake alert.  In no distress.  Morbidly obese Resp: Mildly tachypneic at rest.  Coarse breath sounds bilaterally.  Crackles at the bases.  No wheezing or rhonchi.   Cardio: S1-S2 is normal regular.  No S3-S4.  No rubs murmurs or bruit GI: Abdomen is soft.  Nontender nondistended.  Bowel sounds are present normal.  No masses organomegaly Extremities: Edema noted in the lower extremities better than before. Neurologic: No obvious focal neurological deficits.   Lab Results:  Data Reviewed: I have personally reviewed following labs and imaging studies  CBC: Recent Labs  Lab 02/25/19 0423 02/26/19 0446 02/27/19 0228 02/28/19 0157 03/01/19 0045 03/02/19 0315  WBC 1.7* 5.1 3.1* 5.6 5.2 8.4  NEUTROABS 1.2* 4.2 2.5 4.4 4.3  --   HGB 10.0* 10.3* 10.5* 11.7* 11.3* 11.0*  HCT 31.9* 31.8* 32.7* 35.4* 34.2* 32.9*  MCV 93.0 89.3  90.1 88.9 88.8 88.4  PLT 202 229 261 344 360 [id number]    Basic Metabolic Panel: Recent Labs  Lab 02/25/19 0423 02/26/19 0446 02/27/19 0228 02/28/19 0157 03/01/19 0045 03/02/19 0315 03/03/19 0003  NA 138  138 136 135 136 135 139 135  K 4.1  4.1 3.6 4.1 3.5 4.5 3.4* 4.1  CL 104  103 103 98 97* 101 101 102  CO2 23  22 25 26 26 24 25 22   GLUCOSE 239*  233* 192* 257* 160* 207* 99 184*  BUN 29*  29* 28* 33* 37* 35* 41* 40*  CREATININE 1.08*  1.09* 0.88 1.08* 1.05* 0.99 1.08* 1.10*  CALCIUM 8.1*  8.1* 8.5* 8.2* 8.5* 8.5* 8.8* 8.8*  MG 1.7 1.5* 2.0 1.7 1.8  --  1.9  PHOS 3.7  3.6  --   --   --   --   --   --  GFR: Estimated Creatinine Clearance: 48.4 mL/min (A) (by C-G formula based on SCr of 1.1 mg/dL (H)).  Liver Function Tests: Recent Labs  Lab 02/25/19 0423 02/26/19 0446 02/27/19 0228 02/28/19 0157 03/01/19 0045  AST  --  39 24 24 19   ALT  --  26 21 21 20   ALKPHOS  --  37* 37* 40 41  BILITOT  --  0.5 0.5 0.6 0.5  PROT  --  6.0* 6.0* 6.2* 6.0*  ALBUMIN 3.0*  2.9* 2.7* 2.9* 3.0* 2.8*     CBG: Recent Labs  Lab 03/02/19 0833 03/02/19 1154 03/02/19 1623 03/02/19 2135 03/03/19 0827  GLUCAP 63* 150* 367* 248* 127*     Anemia Panel: Recent Labs    03/01/19 0045  FERRITIN 114    Recent Results (from the past 240 hour(s))  SARS CORONAVIRUS 2 (TAT 6-24 HRS) Nasopharyngeal Nasopharyngeal Swab     Status: Abnormal   Collection Time: 02/24/19 12:28 AM   Specimen: Nasopharyngeal Swab  Result Value Ref Range Status   SARS Coronavirus 2 POSITIVE (A) NEGATIVE Final    Comment: RESULT CALLED TO, READ BACK BY AND VERIFIED WITH: Royetta Crochet RN 15:15 02/24/19 ([email or web address]) (NOTE) SARS-CoV-2 target nucleic acids are DETECTED. The SARS-CoV-2 RNA is generally detectable in upper and lower respiratory specimens during the acute phase of infection. Positive results are indicative of active infection with SARS-CoV-2. Clinical  correlation with patient history and  other diagnostic information is necessary to determine patient infection status. Positive results do  not rule out bacterial infection or co-infection with other viruses. The expected result is Negative. Fact Sheet for Patients: SugarRoll.be Fact Sheet for Healthcare Providers: https://www.woods-mathews.com/ This test is not yet approved or cleared by the [place] FDA and  has been authorized for detection and/or diagnosis of SARS-CoV-2 by FDA under an Emergency Use Authorization (EUA). This EUA will remain  in effect (meaning this test can be used) fo r the duration of the COVID-19 declaration under Section 564(b)(1) of the Act, 21 U.S.C. section 360bbb-3(b)(1), unless the authorization is terminated or revoked sooner. Performed at [facility], [place] [address], [place], [place] [address]       Radiology Studies: No results found.     LOS: 7 days   Garet Hooton [employer] on [email or web address]  03/03/2019, 10:37 AM

## 2019-03-03 NOTE — Plan of Care (Signed)
Discussed with patient plan of care for the evening, pain management and importance of peri-care with using her pure wick with some teach back displayed.  Called and updated her daughter [name] who had e-link called with her earlier today.  Answered all her questions at this time.

## 2019-03-04 LAB — BASIC METABOLIC PANEL
Anion gap: 13 (ref 5–15)
BUN: 39 mg/dL — ABNORMAL HIGH (ref 8–23)
CO2: 23 mmol/L (ref 22–32)
Calcium: 9.2 mg/dL (ref 8.9–10.3)
Chloride: 101 mmol/L (ref 98–111)
Creatinine, Ser: 1.14 mg/dL — ABNORMAL HIGH (ref 0.44–1.00)
GFR calc Af Amer: 56 mL/min — ABNORMAL LOW (ref >60)
GFR calc non Af Amer: 48 mL/min — ABNORMAL LOW (ref >60)
Glucose, Bld: 148 mg/dL — ABNORMAL HIGH (ref 70–99)
Potassium: 4 mmol/L (ref 3.5–5.1)
Sodium: 137 mmol/L (ref 135–145)

## 2019-03-04 LAB — GLUCOSE, CAPILLARY
Glucose-Capillary: 105 mg/dL — ABNORMAL HIGH (ref 70–99)
Glucose-Capillary: 93 mg/dL (ref 70–99)
Glucose-Capillary: 94 mg/dL (ref 70–99)
Glucose-Capillary: 158 mg/dL — ABNORMAL HIGH (ref 70–99)
Glucose-Capillary: 298 mg/dL — ABNORMAL HIGH (ref 70–99)
Glucose-Capillary: 283 mg/dL — ABNORMAL HIGH (ref 70–99)

## 2019-03-04 MED ORDER — INSULIN GLARGINE 100 UNIT/ML ~~LOC~~ SOLN
10.0000 [IU] | Freq: Every day | SUBCUTANEOUS | Status: DC
  Administered 2019-03-04 – 2019-03-05 (×2): 10 [IU] via SUBCUTANEOUS
  Filled 2019-03-04 (×3): qty 0.1

## 2019-03-04 MED ORDER — FUROSEMIDE 10 MG/ML IJ SOLN
40.0000 mg | Freq: Once | INTRAMUSCULAR | Status: AC
  Administered 2019-03-04: 12:00:00 40 mg via INTRAVENOUS
  Filled 2019-03-04: qty 4

## 2019-03-04 NOTE — Progress Notes (Signed)
PROGRESS NOTE  [name] [id number] DOB: [birth year] DOA: 02/23/2019  PCP: Alroy Dust, L.Marlou Sa, MD  Brief History/Interval Summary: 73 year old female with a past medical history of diabetes mellitus type 2, essential hypertension, hyperlipidemia presented with nausea vomiting diarrhea ongoing for a week.  She was noted to be febrile.  Found to be positive for COVID-19.  Hospitalized for further management due to oxygen requirements.  Reason for Visit: Pneumonia due to COVID-19.  Acute respiratory failure with hypoxia  Consultants: None  Procedures: None  Antibiotics: Anti-infectives (From admission, onward)   Start     Dose/Rate Route Frequency Ordered Stop   02/26/19 1600  remdesivir 100 mg in sodium chloride 0.9 % 250 mL IVPB     100 mg 500 mL/hr over 30 Minutes Intravenous Every 24 hours 02/25/19 1335 03/01/19 1601   02/25/19 1600  remdesivir 200 mg in sodium chloride 0.9 % 250 mL IVPB     200 mg 500 mL/hr over 30 Minutes Intravenous Once 02/25/19 1335 02/25/19 1812      Subjective/Interval History: Patient states that she is feeling better.  Feeling cold at times.  Shortness of breath has improved.  Occasional cough.  No nausea vomiting.  Appetite is improving.    Assessment/Plan:  Acute Hypoxic Resp. Failure/Pneumonia due to COVID-19  COVID-19 Labs  Recent Labs    03/02/19 0315 03/03/19 0003  CRP 3.7* 6.4*    Lab Results  Component Value Date   SARSCOV2NAA POSITIVE (A) 02/24/2019     Fever: Remains afebrile Oxygen requirements: Cannula 1 to 2 L/min.  Saturating in the early 90s.   Antibacterials: None Remdesivir: Completed course on 11/11 Steroids: Dexamethasone being tapered down. Diuretics: She has been getting furosemide depending on volume status.  Additional dose to be given today. Actemra: Did not receive Vitamin C and Zinc: Continue DVT Prophylaxis:  Lovenox 55 mg every 24 hours  Patient's respiratory status has improved.  Oxygen  requirements have decreased.  She has completed course of remdesivir.  Steroid is being tapered down.  Inflammatory markers had improved.  She was also given convalescent plasma.  Continue with incentive spirometry, prone positioning and mobilization.  Additional dose of Lasix to be given today.  Good diuresis has been noted.  Acute renal failure Creatinine peaked at 1.57.  Creatinine has improved.  Monitor while she is getting diuresed.  Hypokalemia and hypomagnesemia These have been repleted  Hyperlipidemia On statins.  Diabetes mellitus type 2, uncontrolled with hyperglycemia HbA1c 7.3.  Elevated CBG most likely due to steroids.  CBGs noted to be on the lower side this morning.  We will cut back on the dose of Lantus.  Stop the Tradjenta.    Essential hypertension HCTZ and lisinopril placed on hold due to renal failure.  Blood pressure reasonably controlled for the most part.  Leukopenia Due to COVID-19.  Now resolved.  Normocytic anemia No evidence of overt bleeding.  Hemoglobin has been stable.  Morbid obesity Estimated body mass index is 41.99 kg/m as calculated from the following:   Height as of this encounter: 5' (1.524 m).   Weight as of this encounter: 97.5 kg.   DVT Prophylaxis: Lovenox PUD Prophylaxis: Pepcid Code Status: Full code Family Communication: Discussed with patient.  Daughter being updated daily. Disposition Plan: Skilled nursing facility recommended.  Discussed with patient and daughter.  They are both agreeable.  Social worker consulted.    Medications:  Scheduled: . dexamethasone (DECADRON) injection  4 mg Intravenous Q24H  . enoxaparin (LOVENOX) injection  50 mg Subcutaneous Q24H  . famotidine  20 mg Oral Daily  . insulin aspart  0-20 Units Subcutaneous TID WC  . insulin aspart  0-5 Units Subcutaneous QHS  . insulin aspart  3 Units Subcutaneous TID WC  . insulin glargine  20 Units Subcutaneous QHS  . linagliptin  5 mg Oral Daily  .  rosuvastatin  20 mg Oral Daily  . sertraline  50 mg Oral Daily  . timolol  1 drop Both Eyes Daily  . traZODone  100 mg Oral QHS  . vitamin C  500 mg Oral Daily  . zinc sulfate  220 mg Oral Daily   Continuous:  [id number] **OR** ondansetron (ZOFRAN) IV   Objective:  Vital Signs  Vitals:   03/04/19 0447 03/04/19 0500 03/04/19 0723 03/04/19 0800  BP:   129/86   Pulse:  65 70   Resp:  13 19   Temp:    98.1 F (36.7 C)  TempSrc:    Oral  SpO2: 95% 94% 95% (!) 89%  Weight:      Height:        Intake/Output Summary (Last 24 hours) at 03/04/2019 1001 Last data filed at 03/04/2019 0440 Gross per 24 hour  Intake 600 ml  Output 1650 ml  Net -1050 ml   Filed Weights   02/23/19 2153  Weight: 97.5 kg    General appearance: Awake alert.  In no distress.  Morbidly obese Resp: Mildly tachypneic at rest.  Improved air entry bilaterally.  Coarse breath sounds.  No wheezing or rhonchi.   Cardio: S1-S2 is normal regular.  No S3-S4.  No rubs murmurs or bruit GI: Abdomen is soft.  Nontender nondistended.  Bowel sounds are present normal.  No masses organomegaly Extremities: Minimal edema.  Improved from before.  Able to move her extremities. Neurologic: No focal neurological deficits.    Lab Results:  Data Reviewed: I have personally reviewed following labs and imaging studies  CBC: Recent Labs  Lab 02/26/19 0446 02/27/19 0228 02/28/19 0157 03/01/19 0045 03/02/19 0315  WBC 5.1 3.1* 5.6 5.2 8.4  NEUTROABS 4.2 2.5 4.4 4.3  --   HGB 10.3* 10.5* 11.7* 11.3* 11.0*  HCT 31.8* 32.7* 35.4* 34.2* 32.9*  MCV 89.3 90.1 88.9 88.8 88.4  PLT 229 261 344 360 [id number]    Basic Metabolic Panel: Recent Labs  Lab 02/26/19 0446 02/27/19 0228 02/28/19 0157 03/01/19 0045 03/02/19 0315 03/03/19 0003 03/03/19 1732 03/04/19 0050  NA 136 135 136 135 139 135  --  137  K 3.6 4.1 3.5 4.5 3.4* 4.1  --  4.0  CL 103 98 97* 101 101 102  --  101  CO2 25 26 26 24 25 22   --  23  GLUCOSE 192*  257* 160* 207* 99 184* 421* 148*  BUN 28* 33* 37* 35* 41* 40*  --  39*  CREATININE 0.88 1.08* 1.05* 0.99 1.08* 1.10*  --  1.14*  CALCIUM 8.5* 8.2* 8.5* 8.5* 8.8* 8.8*  --  9.2  MG 1.5* 2.0 1.7 1.8  --  1.9  --   --     GFR: Estimated Creatinine Clearance: 46.7 mL/min (A) (by C-G formula based on SCr of 1.14 mg/dL (H)).  Liver Function Tests: Recent Labs  Lab 02/26/19 0446 02/27/19 0228 02/28/19 0157 03/01/19 0045  AST 39 24 24 19   ALT 26 21 21 20   ALKPHOS 37* 37* 40 41  BILITOT 0.5 0.5 0.6 0.5  PROT 6.0* 6.0* 6.2* 6.0*  ALBUMIN  2.7* 2.9* 3.0* 2.8*     CBG: Recent Labs  Lab 03/03/19 1709 03/03/19 2020 03/04/19 0449 03/04/19 0722 03/04/19 0734  GLUCAP 433* 370* 105* 93 94      Recent Results (from the past 240 hour(s))  SARS CORONAVIRUS 2 (TAT 6-24 HRS) Nasopharyngeal Nasopharyngeal Swab     Status: Abnormal   Collection Time: 02/24/19 12:28 AM   Specimen: Nasopharyngeal Swab  Result Value Ref Range Status   SARS Coronavirus 2 POSITIVE (A) NEGATIVE Final    Comment: RESULT CALLED TO, READ BACK BY AND VERIFIED WITH: Royetta Crochet RN 15:15 02/24/19 ([email or web address]) (NOTE) SARS-CoV-2 target nucleic acids are DETECTED. The SARS-CoV-2 RNA is generally detectable in upper and lower respiratory specimens during the acute phase of infection. Positive results are indicative of active infection with SARS-CoV-2. Clinical  correlation with patient history and other diagnostic information is necessary to determine patient infection status. Positive results do  not rule out bacterial infection or co-infection with other viruses. The expected result is Negative. Fact Sheet for Patients: SugarRoll.be Fact Sheet for Healthcare Providers: https://www.woods-mathews.com/ This test is not yet approved or cleared by the [place] FDA and  has been authorized for detection and/or diagnosis of SARS-CoV-2 by FDA under an Emergency Use Authorization  (EUA). This EUA will remain  in effect (meaning this test can be used) fo r the duration of the COVID-19 declaration under Section 564(b)(1) of the Act, 21 U.S.C. section 360bbb-3(b)(1), unless the authorization is terminated or revoked sooner. Performed at [facility], [place] [address], [place],  [address]       Radiology Studies: No results found.     LOS: 8 days   Kyrie Fludd [employer] on [email or web address]  03/04/2019, 10:01 AM

## 2019-03-04 NOTE — Progress Notes (Signed)
Spoke with pts daughter [name].  Updated on pt status.  Currently working with PT.  All questions answered.

## 2019-03-04 NOTE — TOC Progression Note (Signed)
Transition of Care [facility]) - Progression Note    Patient Details  Name: [name] MRN: [id number] Date of Birth: 21-Apr-1945  Transition of [facility] CM/SW Contact  Eileen Stanford, LCSW Phone Number: 03/04/2019, 10:54 AM  Clinical Narrative:   CSW spoke with pt's daughter regarding SNF and she states her sister works for [employer] and was trying to get pt in there. CSW explained she was unaware of [facility] taking COVID positive pt's. Pt's daughter states her sister said as long as they were quarantined for 10 days they would take them. CSW will follow up with SNF. Pt's daughter also to let CSW know after she speaks with her sister.   Expected Discharge Plan: March ARB Barriers to Discharge: Continued Medical Work up  Expected Discharge Plan and Services Expected Discharge Plan: [place] In-house Referral: Clinical Social Work   Post Acute Care Choice: [place] arrangements for the past 2 months: Single Family Home                 DME Arranged: N/A DME Agency: NA       HH Arranged: PT, OT [place] Agency: [place]         Social Determinants of Health (SDOH) Interventions    Readmission Risk Interventions No flowsheet data found.

## 2019-03-04 NOTE — Progress Notes (Signed)
Physical Therapy Treatment Patient Details Name: [name] MRN: [id number] DOB: [birth year] Today's Date: 03/04/2019    History of Present Illness 73 y/o female w/ hx of mental disorder, macular degeneration, HTN, HLD, galucoma, DM, depression, presented to ED w/ c/o fever N/V/D and gen weakness. Admitted iwth SIRS adn ARF, 11/6 COVID test found +    PT Comments    Pt more cooperative today, states I'm difficult. But wants to go home. Able to complete bed mob with min a to get to edge, sec to bed being too high, sit<>stand from bed and recliner with SBA and RW, ambulated approx 58ft I room with min guard assist, on room air desat to 8%, returned to 3L/min and able to regain saturation to 90s, used flutter valve with cues, incentive spirometer with cues-pulled 1062ml, completed sit<>stand from recliner x 10 w/ SBA and cues. Pt is now off tele.    Follow Up Recommendations  Home health PT;Supervision/Assistance - 24 hour     Equipment Recommendations  Rolling walker with 5" wheels    Recommendations for Other Services       Precautions / Restrictions Precautions Precautions: Fall Restrictions Weight Bearing Restrictions: No    Mobility  Bed Mobility Overal bed mobility: Needs Assistance Bed Mobility: Supine to Sit     Supine to sit: Min assist     General bed mobility comments: uses bed rails, needs bed positioned in low height  Transfers Overall transfer level: Needs assistance Equipment used: Rolling walker (2 wheeled) Transfers: Sit to/from [facility] Sit to Stand: Supervision Stand pivot transfers: Supervision          Ambulation/Gait Ambulation/Gait assistance: Min guard Gait Distance (Feet): 48 Feet Assistive device: Rolling walker (2 wheeled) Gait Pattern/deviations: Step-through pattern;Step-to pattern;Decreased stride length;Wide base of support     General Gait Details: Pt initially on 3L/min via [place] and sats in 90s with ambulation  sats drop to min 88%   Stairs             Wheelchair Mobility    Modified Rankin (Stroke Patients Only)       Balance Overall balance assessment: Needs assistance Sitting-balance support: Feet unsupported;No upper extremity supported Sitting balance-Leahy Scale: Fair     Standing balance support: No upper extremity supported;During functional activity Standing balance-Leahy Scale: Fair                              Cognition Arousal/Alertness: Awake/alert Behavior During Therapy: WFL for tasks assessed/performed Overall Cognitive Status: Within Functional Limits for tasks assessed                                 General Comments: w      Exercises Other Exercises Other Exercises: sit<>stand x 10  Other Exercises: flutter valve x 5 w/ VCS Other Exercises: incentive spirometer x 10 pulls 1070ml w/ vcs    General Comments        Pertinent Vitals/Pain Pain Assessment: No/denies pain    Home Living                      Prior Function            PT Goals (current goals can now be found in the care plan section) Acute Rehab PT Goals Patient Stated Goal: more agreeable to tx today as she wants to  go home PT Goal Formulation: With patient Time For Goal Achievement: 03/11/19 Potential to Achieve Goals: Good Progress towards PT goals: Progressing toward goals    Frequency    Min 3X/week      PT Plan Current plan remains appropriate    Co-evaluation              AM-PAC PT "6 Clicks" Mobility   Outcome Measure  Help needed turning from your back to your side while in a flat bed without using bedrails?: A Little Help needed moving from lying on your back to sitting on the side of a flat bed without using bedrails?: A Little Help needed moving to and from a bed to a chair (including a wheelchair)?: A Little Help needed standing up from a chair using your arms (e.g., wheelchair or bedside chair)?: A Little Help  needed to walk in hospital room?: A Little Help needed climbing 3-5 steps with a railing? : A Lot 6 Click Score: 17    End of Session Equipment Utilized During Treatment: Oxygen Activity Tolerance: Patient tolerated treatment well Patient left: in chair;with call bell/phone within reach Nurse Communication: Mobility status PT Visit Diagnosis: Unsteadiness on feet (R26.81);Muscle weakness (generalized) (M62.81)     Time: [id number] PT Time Calculation (min) (ACUTE ONLY): 23 min  Charges:  $Gait Training: 8-22 mins $Therapeutic Exercise: 8-22 mins                     Horald Chestnut, PT    Delford Field 03/04/2019, 1:42 PM

## 2019-03-05 LAB — GLUCOSE, CAPILLARY
Glucose-Capillary: 123 mg/dL — ABNORMAL HIGH (ref 70–99)
Glucose-Capillary: 167 mg/dL — ABNORMAL HIGH (ref 70–99)
Glucose-Capillary: 362 mg/dL — ABNORMAL HIGH (ref 70–99)
Glucose-Capillary: 346 mg/dL — ABNORMAL HIGH (ref 70–99)

## 2019-03-05 LAB — BASIC METABOLIC PANEL
Anion gap: 13 (ref 5–15)
BUN: 43 mg/dL — ABNORMAL HIGH (ref 8–23)
CO2: 24 mmol/L (ref 22–32)
Calcium: 9.1 mg/dL (ref 8.9–10.3)
Chloride: 98 mmol/L (ref 98–111)
Creatinine, Ser: 1.37 mg/dL — ABNORMAL HIGH (ref 0.44–1.00)
GFR calc Af Amer: 45 mL/min — ABNORMAL LOW (ref >60)
GFR calc non Af Amer: 38 mL/min — ABNORMAL LOW (ref >60)
Glucose, Bld: 144 mg/dL — ABNORMAL HIGH (ref 70–99)
Potassium: 4 mmol/L (ref 3.5–5.1)
Sodium: 135 mmol/L (ref 135–145)

## 2019-03-05 LAB — MAGNESIUM: Magnesium: 2 mg/dL (ref 1.7–2.4)

## 2019-03-05 NOTE — Plan of Care (Signed)
Discussed with patient plan of care for the evening, pain management and the importance of movement with some teach back displayed.  Patient stated she had talked to her relative today and had updated her.

## 2019-03-05 NOTE — Plan of Care (Signed)
Discussed with patient plan of care for the evening, pain management and wanting to stay up in chair at this time with teach back displayed on shifting hips. Updated family as well.

## 2019-03-05 NOTE — Progress Notes (Signed)
PROGRESS NOTE  [name] [id number] DOB: January 29, 1946 DOA: 02/23/2019  PCP: Alroy Dust, L.Marlou Sa, MD  Brief History/Interval Summary: 73 year old female with a past medical history of diabetes mellitus type 2, essential hypertension, hyperlipidemia presented with nausea vomiting diarrhea ongoing for a week.  She was noted to be febrile.  Found to be positive for COVID-19.  Hospitalized for further management due to oxygen requirements.  Reason for Visit: Pneumonia due to COVID-19.  Acute respiratory failure with hypoxia  Consultants: None  Procedures: None  Antibiotics: Anti-infectives (From admission, onward)   Start     Dose/Rate Route Frequency Ordered Stop   02/26/19 1600  remdesivir 100 mg in sodium chloride 0.9 % 250 mL IVPB     100 mg 500 mL/hr over 30 Minutes Intravenous Every 24 hours 02/25/19 1335 03/01/19 1601   02/25/19 1600  remdesivir 200 mg in sodium chloride 0.9 % 250 mL IVPB     200 mg 500 mL/hr over 30 Minutes Intravenous Once 02/25/19 1335 02/25/19 1812      Subjective/Interval History: Patient states that she is feeling better.  No shortness of breath this morning.  No nausea vomiting.  Ready to go to rehab.    Assessment/Plan:  Acute Hypoxic Resp. Failure/Pneumonia due to COVID-19  COVID-19 Labs  Recent Labs    03/03/19 0003  CRP 6.4*    Lab Results  Component Value Date   SARSCOV2NAA POSITIVE (A) 02/24/2019     Fever: Remains afebrile Oxygen requirements: Nasal cannula 1 to 2 L.  Saturating in the early 90s.   Antibacterials: None Remdesivir: Completed course on 11/11 Steroids: Dexamethasone being tapered down. Diuretics: Has received Lasix daily for the last few days.  Will hold today. Actemra: Did not receive Vitamin C and Zinc: Continue DVT Prophylaxis:  Lovenox 55 mg every 24 hours  Patient's respiratory status has improved.  Oxygen requirements have decreased.  She has completed course of remdesivir.  Steroids being tapered  down.  Inflammatory markers have improved.  She was given convalescent plasma as well.  Continue with incentive spirometry, prone positioning and mobilization.  No Lasix today.  Acute renal failure Creatinine peaked at 1.57.  Creatinine had improved.  A slight increase noted today which is most likely due to Lasix.  She has good urine output.  She will not be given any Lasix today.  Recheck labs tomorrow.   Hypokalemia and hypomagnesemia Potassium is normal this morning.  Magnesium is 2.0.  Hyperlipidemia On statins.  Diabetes mellitus type 2, uncontrolled with hyperglycemia HbA1c 7.3.  Elevated CBG most likely due to steroids.  CBGs improving as steroid is tapered down.  Dose of Lantus was decreased yesterday.  Tradjenta discontinued.    Essential hypertension HCTZ and lisinopril placed on hold due to renal failure.  Blood pressure reasonably controlled for the most part.  Leukopenia Due to COVID-19.  Now resolved.  Normocytic anemia No evidence of overt bleeding.  Hemoglobin has been stable.  Morbid obesity Estimated body mass index is 41.99 kg/m as calculated from the following:   Height as of this encounter: 5' (1.524 m).   Weight as of this encounter: 97.5 kg.   DVT Prophylaxis: Lovenox PUD Prophylaxis: Pepcid Code Status: Full code Family Communication: Discussed with patient.  Daughter being updated daily. Disposition Plan: Anticipate discharge to skilled nursing facility tomorrow.     Medications:  Scheduled: . dexamethasone (DECADRON) injection  4 mg Intravenous Q24H  . enoxaparin (LOVENOX) injection  50 mg Subcutaneous Q24H  . famotidine  20 mg Oral Daily  . insulin aspart  0-20 Units Subcutaneous TID WC  . insulin aspart  3 Units Subcutaneous TID WC  . insulin glargine  10 Units Subcutaneous QHS  . rosuvastatin  20 mg Oral Daily  . sertraline  50 mg Oral Daily  . timolol  1 drop Both Eyes Daily  . traZODone  100 mg Oral QHS  . vitamin C  500 mg Oral Daily   . zinc sulfate  220 mg Oral Daily   Continuous:  [id number] **OR** ondansetron (ZOFRAN) IV   Objective:  Vital Signs  Vitals:   03/05/19 0400 03/05/19 0427 03/05/19 0500 03/05/19 0553  BP:  115/61    Pulse: 62 68 63 65  Resp:      Temp:  98.4 F (36.9 C)    TempSrc:  Oral    SpO2: 91% 90% 93% 94%  Weight:      Height:        Intake/Output Summary (Last 24 hours) at 03/05/2019 1059 Last data filed at 03/05/2019 0553 Gross per 24 hour  Intake 960 ml  Output 2025 ml  Net -1065 ml   Filed Weights   02/23/19 2153  Weight: 97.5 kg    General appearance: Awake alert.  In no distress.  Morbidly obese Resp: Normal effort at rest.  Coarse breath sounds bilaterally.  Improved air entry.  Few crackles but better than before.  No wheezing or rhonchi.   Cardio: S1-S2 is normal regular.  No S3-S4.  No rubs murmurs or bruit GI: Abdomen is soft.  Nontender nondistended.  Bowel sounds are present normal.  No masses organomegaly Extremities: No edema.  Full range of motion of lower extremities. Neurologic: Alert and oriented x3.  No focal neurological deficits.     Lab Results:  Data Reviewed: I have personally reviewed following labs and imaging studies  CBC: Recent Labs  Lab 02/27/19 0228 02/28/19 0157 03/01/19 0045 03/02/19 0315  WBC 3.1* 5.6 5.2 8.4  NEUTROABS 2.5 4.4 4.3  --   HGB 10.5* 11.7* 11.3* 11.0*  HCT 32.7* 35.4* 34.2* 32.9*  MCV 90.1 88.9 88.8 88.4  PLT 261 344 360 [id number]    Basic Metabolic Panel: Recent Labs  Lab 02/27/19 0228 02/28/19 0157 03/01/19 0045 03/02/19 0315 03/03/19 0003 03/03/19 1732 03/04/19 0050 03/05/19 0124  NA 135 136 135 139 135  --  137 135  K 4.1 3.5 4.5 3.4* 4.1  --  4.0 4.0  CL 98 97* 101 101 102  --  101 98  CO2 26 26 24 25 22   --  23 24  GLUCOSE 257* 160* 207* 99 184* 421* 148* 144*  BUN 33* 37* 35* 41* 40*  --  39* 43*  CREATININE 1.08* 1.05* 0.99 1.08* 1.10*  --  1.14* 1.37*  CALCIUM 8.2* 8.5* 8.5* 8.8* 8.8*   --  9.2 9.1  MG 2.0 1.7 1.8  --  1.9  --   --  2.0    GFR: Estimated Creatinine Clearance: 38.8 mL/min (A) (by C-G formula based on SCr of 1.37 mg/dL (H)).  Liver Function Tests: Recent Labs  Lab 02/27/19 0228 02/28/19 0157 03/01/19 0045  AST 24 24 19   ALT 21 21 20   ALKPHOS 37* 40 41  BILITOT 0.5 0.6 0.5  PROT 6.0* 6.2* 6.0*  ALBUMIN 2.9* 3.0* 2.8*     CBG: Recent Labs  Lab 03/04/19 0734 03/04/19 1141 03/04/19 1658 03/04/19 2044 03/05/19 0712  GLUCAP 94 158* 298* 283* 123*  Recent Results (from the past 240 hour(s))  SARS CORONAVIRUS 2 (TAT 6-24 HRS) Nasopharyngeal Nasopharyngeal Swab     Status: Abnormal   Collection Time: 02/24/19 12:28 AM   Specimen: Nasopharyngeal Swab  Result Value Ref Range Status   SARS Coronavirus 2 POSITIVE (A) NEGATIVE Final    Comment: RESULT CALLED TO, READ BACK BY AND VERIFIED WITH: Royetta Crochet RN 15:15 02/24/19 ([email or web address]) (NOTE) SARS-CoV-2 target nucleic acids are DETECTED. The SARS-CoV-2 RNA is generally detectable in upper and lower respiratory specimens during the acute phase of infection. Positive results are indicative of active infection with SARS-CoV-2. Clinical  correlation with patient history and other diagnostic information is necessary to determine patient infection status. Positive results do  not rule out bacterial infection or co-infection with other viruses. The expected result is Negative. Fact Sheet for Patients: SugarRoll.be Fact Sheet for Healthcare Providers: https://www.woods-mathews.com/ This test is not yet approved or cleared by the [place] FDA and  has been authorized for detection and/or diagnosis of SARS-CoV-2 by FDA under an Emergency Use Authorization (EUA). This EUA will remain  in effect (meaning this test can be used) fo r the duration of the COVID-19 declaration under Section 564(b)(1) of the Act, 21 U.S.C. section 360bbb-3(b)(1), unless the  authorization is terminated or revoked sooner. Performed at [facility], [place] [address], [place], [place] [address]       Radiology Studies: No results found.     LOS: 9 days   Jozeph Persing [employer] on [email or web address]  03/05/2019, 10:59 AM

## 2019-03-06 DIAGNOSIS — E1165 Type 2 diabetes mellitus with hyperglycemia: Secondary | ICD-10-CM | POA: Diagnosis not present

## 2019-03-06 DIAGNOSIS — U071 COVID-19: Secondary | ICD-10-CM | POA: Diagnosis not present

## 2019-03-06 DIAGNOSIS — J9601 Acute respiratory failure with hypoxia: Secondary | ICD-10-CM | POA: Diagnosis not present

## 2019-03-06 DIAGNOSIS — Z7401 Bed confinement status: Secondary | ICD-10-CM | POA: Diagnosis not present

## 2019-03-06 DIAGNOSIS — J1289 Other viral pneumonia: Secondary | ICD-10-CM | POA: Diagnosis not present

## 2019-03-06 DIAGNOSIS — R41841 Cognitive communication deficit: Secondary | ICD-10-CM | POA: Diagnosis not present

## 2019-03-06 DIAGNOSIS — M6281 Muscle weakness (generalized): Secondary | ICD-10-CM | POA: Diagnosis not present

## 2019-03-06 DIAGNOSIS — R5381 Other malaise: Secondary | ICD-10-CM | POA: Diagnosis not present

## 2019-03-06 DIAGNOSIS — M2550 Pain in unspecified joint: Secondary | ICD-10-CM | POA: Diagnosis not present

## 2019-03-06 DIAGNOSIS — J9621 Acute and chronic respiratory failure with hypoxia: Secondary | ICD-10-CM | POA: Diagnosis not present

## 2019-03-06 LAB — BASIC METABOLIC PANEL
Anion gap: 13 (ref 5–15)
BUN: 41 mg/dL — ABNORMAL HIGH (ref 8–23)
CO2: 23 mmol/L (ref 22–32)
Calcium: 9.1 mg/dL (ref 8.9–10.3)
Chloride: 97 mmol/L — ABNORMAL LOW (ref 98–111)
Creatinine, Ser: 1.32 mg/dL — ABNORMAL HIGH (ref 0.44–1.00)
GFR calc Af Amer: 47 mL/min — ABNORMAL LOW (ref >60)
GFR calc non Af Amer: 40 mL/min — ABNORMAL LOW (ref >60)
Glucose, Bld: 274 mg/dL — ABNORMAL HIGH (ref 70–99)
Potassium: 4.1 mmol/L (ref 3.5–5.1)
Sodium: 133 mmol/L — ABNORMAL LOW (ref 135–145)

## 2019-03-06 LAB — GLUCOSE, CAPILLARY
Glucose-Capillary: 173 mg/dL — ABNORMAL HIGH (ref 70–99)
Glucose-Capillary: 129 mg/dL — ABNORMAL HIGH (ref 70–99)

## 2019-03-06 MED ORDER — FAMOTIDINE 20 MG PO TABS: 20.0000 mg | ORAL_TABLET | Freq: Every day | ORAL | 0 refills | Status: AC

## 2019-03-06 MED ORDER — DEXAMETHASONE 2 MG PO TABS: ORAL_TABLET | ORAL | 0 refills | Status: AC

## 2019-03-06 MED ORDER — INSULIN GLARGINE 100 UNIT/ML ~~LOC~~ SOLN: 8.0000 [IU] | Freq: Every day | SUBCUTANEOUS | 11 refills | Status: AC

## 2019-03-06 MED ORDER — TRAZODONE HCL 50 MG PO TABS: 50.0000 mg | ORAL_TABLET | Freq: Every day | ORAL | Status: AC

## 2019-03-06 NOTE — Progress Notes (Signed)
Patient set to discharge to [facility] today- please call report to [phone]. CSW notified patients daughter, [name], no concerns. [facility] called for transportation- scheduled for 2PM.  Kingsley Spittle, LCSW [facility] [place]  [phone]

## 2019-03-06 NOTE — Discharge Summary (Addendum)
[facility]  Physician Discharge Summary   Patient ID: MELEENA HERLONG MRN: [id number] DOB/AGE: Aug 19, 1945 73 y.o.  Admit date: 02/23/2019 Discharge date: 03/06/2019  PCP: Alroy Dust, L.Marlou Sa, MD  DISCHARGE DIAGNOSES:  Pneumonia due to COVID-19 Acute respiratory failure with hypoxia Acute renal failure, improved Diabetes mellitus type 2, uncontrolled with hyperglycemia Hyperlipidemia Essential hypertension Normocytic anemia Morbid obesity  RECOMMENDATIONS FOR OUTPATIENT FOLLOW UP: 1. Please monitor CBGs and titrate dose of Lantus as CBGs decrease with tapering of the steroids. 2. Check basic metabolic panel next week. 3. Consider amlodipine if blood pressure starts creeping up.   Home Health: None Equipment/Devices: None  CODE STATUS: Full code  DISCHARGE CONDITION: fair  Diet recommendation: Modified carbohydrate  INITIAL HISTORY: 73 year old female with a past medical history of diabetes mellitus type 2, essential hypertension, hyperlipidemia presented with nausea vomiting diarrhea ongoing for a week.  She was noted to be febrile.  Found to be positive for COVID-19.  Hospitalized for further management due to oxygen requirements.   HOSPITAL COURSE:   Acute Hypoxic Resp. Failure/Pneumonia due to COVID-19 Patient was admitted to the hospital.  She was requiring high amounts of oxygen initially.  She was started on steroids and remdesivir.  She was given convalescent plasma as well.  She slowly started improving.  She is now down to 1 L of oxygen.  A lot of time she does okay on room air as well.  Her inflammatory markers have improved.  She feels much better.  Steroids will be tapered down.  Continue with incentive spirometry mobilization.  She was also given Lasix for a few days.  No need to continue.  Acute renal failure Creatinine peaked at 1.57.   She was also given Lasix with improvement in urine output.  Creatinine is stable.  Monitor  periodically.  Hypokalemia and hypomagnesemia These were repleted  Hyperlipidemia On statins.  Diabetes mellitus type 2, uncontrolled with hyperglycemia HbA1c 7.3.    Elevated CBG due to steroids.  Currently she is requiring Lantus but I anticipate that there is can be titrated down as steroid is tapered.  She is on Metformin which can be continued.   Essential hypertension Lisinopril and HCTZ on hold due to elevated creatinine.  Pressure has been very well controlled here without any medications.  Consider amlodipine if she becomes hypertensive.  Leukopenia Due to COVID-19.  Now resolved.  Normocytic anemia No evidence of overt bleeding.  Hemoglobin has been stable.  Morbid obesity Estimated body mass index is 41.99 kg/m as calculated from the following:   Height as of this encounter: 5' (1.524 m).   Weight as of this encounter: 97.5 kg.  Overall stable.  Okay for discharge to skilled nursing facility for short-term rehab.     PERTINENT LABS:  The results of significant diagnostics from this hospitalization (including imaging, microbiology, ancillary and laboratory) are listed below for reference.     Labs:  COVID-19 Labs   Lab Results  Component Value Date   SARSCOV2NAA POSITIVE (A) 02/24/2019      Basic Metabolic Panel: Recent Labs  Lab 02/28/19 0157 03/01/19 0045 03/02/19 0315 03/03/19 0003 03/03/19 1732 03/04/19 0050 03/05/19 0124 03/06/19 0222  NA 136 135 139 135  --  137 135 133*  K 3.5 4.5 3.4* 4.1  --  4.0 4.0 4.1  CL 97* 101 101 102  --  101 98 97*  CO2 26 24 25 22   --  23 24 23   GLUCOSE 160* 207* 99 184* 421* 148*  144* 274*  BUN 37* 35* 41* 40*  --  39* 43* 41*  CREATININE 1.05* 0.99 1.08* 1.10*  --  1.14* 1.37* 1.32*  CALCIUM 8.5* 8.5* 8.8* 8.8*  --  9.2 9.1 9.1  MG 1.7 1.8  --  1.9  --   --  2.0  --    Liver Function Tests: Recent Labs  Lab 02/28/19 0157 03/01/19 0045  AST 24 19  ALT 21 20  ALKPHOS 40 41  BILITOT 0.6  0.5  PROT 6.2* 6.0*  ALBUMIN 3.0* 2.8*   CBC: Recent Labs  Lab 02/28/19 0157 03/01/19 0045 03/02/19 0315  WBC 5.6 5.2 8.4  NEUTROABS 4.4 4.3  --   HGB 11.7* 11.3* 11.0*  HCT 35.4* 34.2* 32.9*  MCV 88.9 88.8 88.4  PLT 344 360 389   BNP: BNP (last 3 results) Recent Labs    02/27/19 0228 02/28/19 0157 03/01/19 0045  BNP 115.9* 35.0 30.4    CBG: Recent Labs  Lab 03/05/19 0712 03/05/19 1136 03/05/19 1604 03/05/19 2257 03/06/19 0724  GLUCAP 123* 167* 362* 346* 173*     IMAGING STUDIES Dg Chest 2 View  Result Date: 02/23/2019 CLINICAL DATA:  Cough, weakness EXAM: CHEST - 2 VIEW COMPARISON:  09/24/2016 FINDINGS: Cardiomegaly. Low lung volumes with bibasilar atelectasis. No overt edema. No effusions. No acute bony abnormality. IMPRESSION: Cardiomegaly.  Bibasilar atelectasis. Electronically Signed   By: Rolm Baptise M.D.   On: 02/23/2019 22:28   Dg Chest Port 1 View  Result Date: 02/26/2019 CLINICAL DATA:  Hypoxia EXAM: PORTABLE CHEST 1 VIEW COMPARISON:  Chest radiograph 02/23/2019 FINDINGS: Monitoring leads overlie the patient. Stable cardiomegaly. Interval increase in bilateral mid lower lung heterogeneous opacities. No pleural effusion or pneumothorax. Thoracic spine degenerative changes. IMPRESSION: Cardiomegaly. Increasing bilateral mid lower lung heterogeneous opacities which may represent edema, pneumonia or atypical viral infection. Electronically Signed   By: Lovey Newcomer M.D.   On: 02/26/2019 13:57    DISCHARGE EXAMINATION: Vitals:   03/06/19 0400 03/06/19 0500 03/06/19 0600 03/06/19 0730  BP:    136/64  Pulse: 64 62 62 67  Resp:      Temp:    98.4 F (36.9 C)  TempSrc:    Oral  SpO2: 90% 93% 91% 94%  Weight:      Height:       General appearance: Awake alert.  In no distress.  Morbidly obese Resp: Improved air entry bilaterally.  Few crackles at the bases.  No wheezing or rhonchi.  Normal effort. Cardio: S1-S2 is normal regular.  No S3-S4.  No rubs  murmurs or bruit GI: Abdomen is soft.  Nontender nondistended.  Bowel sounds are present normal.  No masses organomegaly Extremities: Improved edema bilateral lower extremities Neurologic: Alert and oriented x3.  No focal neurological deficits.    DISPOSITION: SNF  Discharge Instructions    Call MD for:  difficulty breathing, headache or visual disturbances   Complete by: As directed    Call MD for:  extreme fatigue   Complete by: As directed    Call MD for:  persistant dizziness or light-headedness   Complete by: As directed    Call MD for:  persistant nausea and vomiting   Complete by: As directed    Call MD for:  severe uncontrolled pain   Complete by: As directed    Call MD for:  temperature >100.4   Complete by: As directed    Diet Carb Modified   Complete by: As directed  Discharge instructions   Complete by: As directed    Please review instructions on the discharge summary.  COVID 19 INSTRUCTIONS  - You are felt to be stable enough to no longer require inpatient monitoring, testing, and treatment, though you will need to follow the recommendations below: - Based on the CDC's non-test criteria for ending self-isolation: You may not return to work/leave the home until at least 21 days since symptom onset AND 3 days without a fever (without taking tylenol, ibuprofen, etc.) AND have improvement in respiratory symptoms. - Do not take NSAID medications (including, but not limited to, ibuprofen, advil, motrin, naproxen, aleve, goody's powder, etc.) - Follow up with your doctor in the next week via telehealth or seek medical attention right away if your symptoms get WORSE.  - Consider donating plasma after you have recovered (either 14 days after a negative test or 28 days after symptoms have completely resolved) because your antibodies to this virus may be helpful to give to others with life-threatening infections. Please go to the website [email or web address] if you would like to  consider volunteering for plasma donation.    Directions for you at home:  Wear a facemask You should wear a facemask that covers your nose and mouth when you are in the same room with other people and when you visit a healthcare provider. People who live with or visit you should also wear a facemask while they are in the same room with you.  Separate yourself from other people in your home As much as possible, you should stay in a different room from other people in your home. Also, you should use a separate bathroom, if available.  Avoid sharing household items You should not share dishes, drinking glasses, cups, eating utensils, towels, bedding, or other items with other people in your home. After using these items, you should wash them thoroughly with soap and water.  Cover your coughs and sneezes Cover your mouth and nose with a tissue when you cough or sneeze, or you can cough or sneeze into your sleeve. Throw used tissues in a lined trash can, and immediately wash your hands with soap and water for at least 20 seconds or use an alcohol-based hand rub.  Wash your [facility] your hands often and thoroughly with soap and water for at least 20 seconds. You can use an alcohol-based hand sanitizer if soap and water are not available and if your hands are not visibly dirty. Avoid touching your eyes, nose, and mouth with unwashed hands.  Directions for those who live with, or provide care at home for you:  Limit the number of people who have contact with the patient If possible, have only one caregiver for the patient. Other household members should stay in another home or place of residence. If this is not possible, they should stay in another room, or be separated from the patient as much as possible. Use a separate bathroom, if available. Restrict visitors who do not have an essential need to be in the home.  Ensure good ventilation Make sure that shared spaces in the home have  good air flow, such as from an air conditioner or an opened window, weather permitting.  Wash your hands often Wash your hands often and thoroughly with soap and water for at least 20 seconds. You can use an alcohol based hand sanitizer if soap and water are not available and if your hands are not visibly dirty. Avoid touching your eyes, nose, and mouth  with unwashed hands. Use disposable paper towels to dry your hands. If not available, use dedicated cloth towels and replace them when they become wet.  Wear a facemask and gloves Wear a disposable facemask at all times in the room and gloves when you touch or have contact with the patients blood, body fluids, and/or secretions or excretions, such as sweat, saliva, sputum, nasal mucus, vomit, urine, or feces.  Ensure the mask fits over your nose and mouth tightly, and do not touch it during use. Throw out disposable facemasks and gloves after using them. Do not reuse. Wash your hands immediately after removing your facemask and gloves. If your personal clothing becomes contaminated, carefully remove clothing and launder. Wash your hands after handling contaminated clothing. Place all used disposable facemasks, gloves, and other waste in a lined container before disposing them with other household waste. Remove gloves and wash your hands immediately after handling these items.  Do not share dishes, glasses, or other household items with the patient Avoid sharing household items. You should not share dishes, drinking glasses, cups, eating utensils, towels, bedding, or other items with a patient who is confirmed to have, or being evaluated for, COVID-19 infection. After the person uses these items, you should wash them thoroughly with soap and water.  Wash laundry thoroughly Immediately remove and wash clothes or bedding that have blood, body fluids, and/or secretions or excretions, such as sweat, saliva, sputum, nasal mucus, vomit, urine, or  feces, on them. Wear gloves when handling laundry from the patient. Read and follow directions on labels of laundry or clothing items and detergent. In general, wash and dry with the warmest temperatures recommended on the label.  Clean all areas the individual has used often Clean all touchable surfaces, such as counters, tabletops, doorknobs, bathroom fixtures, toilets, phones, keyboards, tablets, and bedside tables, every day. Also, clean any surfaces that may have blood, body fluids, and/or secretions or excretions on them. Wear gloves when cleaning surfaces the patient has come in contact with. Use a diluted bleach solution (e.g., dilute bleach with 1 part bleach and 10 parts water) or a household disinfectant with a label that says EPA-registered for coronaviruses. To make a bleach solution at home, add 1 tablespoon of bleach to 1 quart (4 cups) of water. For a larger supply, add  cup of bleach to 1 gallon (16 cups) of water. Read labels of cleaning products and follow recommendations provided on product labels. Labels contain instructions for safe and effective use of the cleaning product including precautions you should take when applying the product, such as wearing gloves or eye protection and making sure you have good ventilation during use of the product. Remove gloves and wash hands immediately after cleaning.  Monitor yourself for signs and symptoms of illness Caregivers and household members are considered close contacts, should monitor their health, and will be asked to limit movement outside of the home to the extent possible. Follow the monitoring steps for close contacts listed on the symptom monitoring form.   If you have additional questions, contact your local health department or call the epidemiologist on call at [phone] (available 24/7). This guidance is subject to change. For the most up-to-date guidance from [facility], please refer to their  website: YouBlogs.pl   You were cared for by a hospitalist during your hospital stay. If you have any questions about your discharge medications or the care you received while you were in the hospital after you are discharged, you can call the  unit and asked to speak with the hospitalist on call if the hospitalist that took care of you is not available. Once you are discharged, your primary care physician will handle any further medical issues. Please note that NO REFILLS for any discharge medications will be authorized once you are discharged, as it is imperative that you return to your primary care physician (or establish a relationship with a primary care physician if you do not have one) for your aftercare needs so that they can reassess your need for medications and monitor your lab values. If you do not have a primary care physician, you can call [phone] for a physician referral.   Increase activity slowly   Complete by: As directed         Allergies as of 03/06/2019      Reactions   Codeine Nausea And Vomiting   Penicillins Swelling   Childhood reaction   Sulfa Antibiotics Swelling   Childhood reaction   Tylenol [acetaminophen] Nausea And Vomiting      Medication List    STOP taking these medications   docusate sodium 100 MG capsule Commonly known as: Colace   HYDROcodone-ibuprofen 7.5-200 MG tablet Commonly known as: Vicoprofen   ibuprofen 800 MG tablet Commonly known as: ADVIL   lisinopril-hydrochlorothiazide 20-12.5 MG tablet Commonly known as: ZESTORETIC     TAKE these medications   dexamethasone 2 MG tablet Commonly known as: DECADRON Take 2 tablets once daily for 3 days, then 1 tablet once daily for 3 days, then STOP.   famotidine 20 MG tablet Commonly known as: PEPCID Take 1 tablet (20 mg total) by mouth daily for 10 days.   glimepiride 4 MG tablet Commonly known as: AMARYL Take 4 mg by  mouth daily with breakfast.   ICAPS AREDS 2 PO Take 1 capsule by mouth 2 (two) times daily.   insulin glargine 100 UNIT/ML injection Commonly known as: LANTUS Inject 0.08 mLs (8 Units total) into the skin at bedtime.   metFORMIN 1000 MG tablet Commonly known as: GLUCOPHAGE Take 1 tablet (1,000 mg total) by mouth 2 (two) times daily.   multivitamin with minerals Tabs tablet Take 1 tablet by mouth daily.   rosuvastatin 20 MG tablet Commonly known as: CRESTOR Take 20 mg by mouth daily.   sertraline 50 MG tablet Commonly known as: ZOLOFT Take 50 mg by mouth daily.   timolol 0.5 % ophthalmic solution Commonly known as: TIMOPTIC Place 1 drop into both eyes daily.   traZODone 50 MG tablet Commonly known as: DESYREL Take 1 tablet (50 mg total) by mouth at bedtime.         Contact information for follow-up providers    Alroy Dust, L.Marlou Sa, MD. Schedule an appointment as soon as possible for a visit in 2 week(s).   Specialty: Family Medicine Contact information: [address] [place] [address]            Contact information for after-discharge care    Destination    [facility] .   Service: Skilled Nursing Contact information: [place] [phone]                  TOTAL DISCHARGE TIME: 52 minutes  [place]  [facility] Pager on [email or web address]  03/06/2019, 10:17 AM

## 2019-03-06 NOTE — NC FL2 (Addendum)
[place] LEVEL OF CARE SCREENING TOOL     IDENTIFICATION  Patient Name: [name] Birthdate: [birth year] Sex: female Admission Date (Current Location): 02/23/2019  [facility] and [place] Number:  Herbalist and Address:  [facility] [place], [place] [address] [place], [place] [address]      Provider Number: [id number]  Attending Physician Name and Address:  Bonnielee Haff, MD  Relative Name and Phone Number:       Current Level of Care: Hospital Recommended Level of Care: [place] Prior Approval Number:    Date Approved/Denied:   PASRR Number: pending Discharge Plan: SNF    Current Diagnoses: Patient Active Problem List   Diagnosis Date Noted  . SIRS (systemic inflammatory response syndrome) ([place]) 02/24/2019  . ARF (acute renal failure) ([place]) 02/24/2019  . Nausea vomiting and diarrhea 02/24/2019  . Sepsis ([place]) 09/17/2016  . Acute lower UTI 09/17/2016  . Uncontrolled type 2 diabetes mellitus with hyperglycemia ([place]) 09/17/2016  . Essential hypertension 09/17/2016    Orientation RESPIRATION BLADDER Height & Weight     Self, Time, Situation, Place  Normal Continent Weight: 215 lb (97.5 kg) Height:  5' (152.4 cm)  BEHAVIORAL SYMPTOMS/MOOD NEUROLOGICAL BOWEL NUTRITION STATUS      Continent Diet(heart healthy/carb modified.)  AMBULATORY STATUS COMMUNICATION OF NEEDS Skin   Limited Assist Verbally Normal                       Personal Care Assistance Level of Assistance  Bathing, Feeding, Dressing Bathing Assistance: Limited assistance Feeding assistance: Independent Dressing Assistance: Limited assistance     Functional Limitations Info  Sight, Hearing, Speech Sight Info: Adequate Hearing Info: Adequate Speech Info: Adequate    SPECIAL CARE FACTORS FREQUENCY  PT (By licensed PT), OT (By licensed OT)     PT Frequency: 5x OT Frequency: 5x            Contractures Contractures Info: Not  present    Additional Factors Info  Code Status, Allergies, Isolation Precautions Code Status Info: Full Code Allergies Info: Codeine, Penicillins, Sulfa Antibiotics, Tylenol (Acetaminophen)     Isolation Precautions Info: COVID     Current Medications (03/06/2019):  This is the current hospital active medication list Current Facility-Administered Medications  Medication Dose Route Frequency Provider Last Rate Last Dose  . dexamethasone (DECADRON) injection 4 mg  4 mg Intravenous Q24H Bonnielee Haff, MD   4 mg at 03/06/19 0949  . enoxaparin (LOVENOX) injection 50 mg  50 mg Subcutaneous Q24H Elodia Florence., MD   50 mg at 03/06/19 0829  . famotidine (PEPCID) tablet 20 mg  20 mg Oral Daily Bonnielee Haff, MD   20 mg at 03/06/19 0949  . insulin aspart (novoLOG) injection 0-20 Units  0-20 Units Subcutaneous TID WC Elodia Florence., MD   4 Units at 03/06/19 [phone]  . insulin aspart (novoLOG) injection 3 Units  3 Units Subcutaneous TID WC Elodia Florence., MD   3 Units at 03/06/19 [phone]  . insulin glargine (LANTUS) injection 10 Units  10 Units Subcutaneous QHS Bonnielee Haff, MD   10 Units at 03/05/19 2258  . ondansetron (ZOFRAN) tablet 4 mg  4 mg Oral Q6H PRN Rise Patience, MD       Or  . ondansetron [facility]) injection 4 mg  4 mg Intravenous Q6H PRN Rise Patience, MD   4 mg at 02/26/19 0914  . rosuvastatin (CRESTOR)  tablet 20 mg  20 mg Oral Daily Rise Patience, MD   20 mg at 03/06/19 0949  . sertraline (ZOLOFT) tablet 50 mg  50 mg Oral Daily Rise Patience, MD   50 mg at 03/06/19 0949  . timolol (TIMOPTIC) 0.5 % ophthalmic solution 1 drop  1 drop Both Eyes Daily Rise Patience, MD   1 drop at 03/06/19 0950  . traZODone (DESYREL) tablet 100 mg  100 mg Oral QHS Elodia Florence., MD   100 mg at 03/05/19 2255  . vitamin C (ASCORBIC ACID) tablet 500 mg  500 mg Oral Daily Dana Allan I, MD   500 mg at 03/06/19 0949  . zinc sulfate  capsule 220 mg  220 mg Oral Daily Bonnielee Haff, MD   220 mg at 03/06/19 [id number]     Discharge Medications: Please see discharge summary for a list of discharge medications.  Relevant Imaging Results:  Relevant Lab Results:   Additional Information [id number]  Gerrianne Scale Kal Chait, LCSW

## 2019-03-06 NOTE — Discharge Instructions (Signed)

## 2019-03-07 DIAGNOSIS — E1165 Type 2 diabetes mellitus with hyperglycemia: Secondary | ICD-10-CM | POA: Diagnosis not present

## 2019-03-07 DIAGNOSIS — J9601 Acute respiratory failure with hypoxia: Secondary | ICD-10-CM | POA: Diagnosis not present

## 2019-03-07 DIAGNOSIS — U071 COVID-19: Secondary | ICD-10-CM | POA: Diagnosis not present

## 2019-03-07 DIAGNOSIS — J1289 Other viral pneumonia: Secondary | ICD-10-CM | POA: Diagnosis not present

## 2019-03-08 DIAGNOSIS — U071 COVID-19: Secondary | ICD-10-CM | POA: Diagnosis not present

## 2019-03-08 DIAGNOSIS — E1165 Type 2 diabetes mellitus with hyperglycemia: Secondary | ICD-10-CM | POA: Diagnosis not present

## 2019-03-08 DIAGNOSIS — J1289 Other viral pneumonia: Secondary | ICD-10-CM | POA: Diagnosis not present

## 2019-03-08 DIAGNOSIS — J9621 Acute and chronic respiratory failure with hypoxia: Secondary | ICD-10-CM | POA: Diagnosis not present

## 2019-03-28 DIAGNOSIS — R300 Dysuria: Secondary | ICD-10-CM | POA: Diagnosis not present

## 2019-05-02 DIAGNOSIS — E119 Type 2 diabetes mellitus without complications: Secondary | ICD-10-CM | POA: Diagnosis not present

## 2019-05-16 DIAGNOSIS — H353134 Nonexudative age-related macular degeneration, bilateral, advanced atrophic with subfoveal involvement: Secondary | ICD-10-CM | POA: Diagnosis not present

## 2019-05-16 DIAGNOSIS — H35341 Macular cyst, hole, or pseudohole, right eye: Secondary | ICD-10-CM | POA: Diagnosis not present

## 2019-05-16 DIAGNOSIS — H401134 Primary open-angle glaucoma, bilateral, indeterminate stage: Secondary | ICD-10-CM | POA: Diagnosis not present

## 2019-05-16 DIAGNOSIS — E119 Type 2 diabetes mellitus without complications: Secondary | ICD-10-CM | POA: Diagnosis not present

## 2019-05-16 DIAGNOSIS — Z961 Presence of intraocular lens: Secondary | ICD-10-CM | POA: Diagnosis not present

## 2019-07-04 DIAGNOSIS — F320 Major depressive disorder, single episode, mild: Secondary | ICD-10-CM | POA: Diagnosis not present

## 2019-07-04 DIAGNOSIS — I10 Essential (primary) hypertension: Secondary | ICD-10-CM | POA: Diagnosis not present

## 2019-07-04 DIAGNOSIS — E1169 Type 2 diabetes mellitus with other specified complication: Secondary | ICD-10-CM | POA: Diagnosis not present

## 2019-07-04 DIAGNOSIS — E7800 Pure hypercholesterolemia, unspecified: Secondary | ICD-10-CM | POA: Diagnosis not present

## 2019-07-04 DIAGNOSIS — M150 Primary generalized (osteo)arthritis: Secondary | ICD-10-CM | POA: Diagnosis not present

## 2019-11-14 DIAGNOSIS — E119 Type 2 diabetes mellitus without complications: Secondary | ICD-10-CM | POA: Diagnosis not present

## 2019-11-14 DIAGNOSIS — H401134 Primary open-angle glaucoma, bilateral, indeterminate stage: Secondary | ICD-10-CM | POA: Diagnosis not present

## 2019-11-14 DIAGNOSIS — Z961 Presence of intraocular lens: Secondary | ICD-10-CM | POA: Diagnosis not present

## 2019-11-14 DIAGNOSIS — H35341 Macular cyst, hole, or pseudohole, right eye: Secondary | ICD-10-CM | POA: Diagnosis not present

## 2019-11-14 DIAGNOSIS — H353134 Nonexudative age-related macular degeneration, bilateral, advanced atrophic with subfoveal involvement: Secondary | ICD-10-CM | POA: Diagnosis not present

## 2019-11-22 DIAGNOSIS — L723 Sebaceous cyst: Secondary | ICD-10-CM | POA: Diagnosis not present

## 2020-01-30 DIAGNOSIS — E7800 Pure hypercholesterolemia, unspecified: Secondary | ICD-10-CM | POA: Diagnosis not present

## 2020-01-30 DIAGNOSIS — Z23 Encounter for immunization: Secondary | ICD-10-CM | POA: Diagnosis not present

## 2020-01-30 DIAGNOSIS — E1169 Type 2 diabetes mellitus with other specified complication: Secondary | ICD-10-CM | POA: Diagnosis not present

## 2020-01-30 DIAGNOSIS — M150 Primary generalized (osteo)arthritis: Secondary | ICD-10-CM | POA: Diagnosis not present

## 2020-01-30 DIAGNOSIS — F320 Major depressive disorder, single episode, mild: Secondary | ICD-10-CM | POA: Diagnosis not present

## 2020-01-30 DIAGNOSIS — I10 Essential (primary) hypertension: Secondary | ICD-10-CM | POA: Diagnosis not present

## 2020-01-30 DIAGNOSIS — Z0000 Encounter for general adult medical examination without abnormal findings: Secondary | ICD-10-CM | POA: Diagnosis not present

## 2020-01-30 DIAGNOSIS — J309 Allergic rhinitis, unspecified: Secondary | ICD-10-CM | POA: Diagnosis not present

## 2020-04-18 DIAGNOSIS — Z23 Encounter for immunization: Secondary | ICD-10-CM | POA: Diagnosis not present

## 2020-06-12 ENCOUNTER — Ambulatory Visit (HOSPITAL_COMMUNITY)
Admission: EM | Admit: 2020-06-12 | Discharge: 2020-06-12 | Disposition: A | Discharge status: Home / Self Care | Payer: Medicare Other | Attending: Family Medicine | Admitting: Family Medicine

## 2020-06-12 ENCOUNTER — Encounter (HOSPITAL_COMMUNITY): Payer: Self-pay | Admitting: *Deleted

## 2020-06-12 ENCOUNTER — Ambulatory Visit (INDEPENDENT_AMBULATORY_CARE_PROVIDER_SITE_OTHER): Payer: Medicare Other

## 2020-06-12 ENCOUNTER — Other Ambulatory Visit: Payer: Self-pay

## 2020-06-12 DIAGNOSIS — M25511 Pain in right shoulder: Secondary | ICD-10-CM | POA: Diagnosis not present

## 2020-06-12 DIAGNOSIS — S4291XA Fracture of right shoulder girdle, part unspecified, initial encounter for closed fracture: Secondary | ICD-10-CM

## 2020-06-12 DIAGNOSIS — W19XXXA Unspecified fall, initial encounter: Secondary | ICD-10-CM

## 2020-06-12 DIAGNOSIS — S42351A Displaced comminuted fracture of shaft of humerus, right arm, initial encounter for closed fracture: Secondary | ICD-10-CM | POA: Diagnosis not present

## 2020-06-12 DIAGNOSIS — S42291A Other displaced fracture of upper end of right humerus, initial encounter for closed fracture: Secondary | ICD-10-CM | POA: Diagnosis not present

## 2020-06-12 MED ORDER — HYDROCODONE-ACETAMINOPHEN 5-325 MG PO TABS
ORAL_TABLET | ORAL | Status: AC
  Filled 2020-06-12: qty 1

## 2020-06-12 MED ORDER — HYDROCODONE-ACETAMINOPHEN 5-325 MG PO TABS: 1.0000 | ORAL_TABLET | Freq: Four times a day (QID) | ORAL | 0 refills | Status: AC | PRN

## 2020-06-12 MED ORDER — HYDROCODONE-ACETAMINOPHEN 5-325 MG PO TABS: 1.0000 | ORAL_TABLET | Freq: Once | ORAL | Status: DC

## 2020-06-12 MED ORDER — HYDROCODONE-ACETAMINOPHEN 5-325 MG PO TABS: 1.0000 | ORAL_TABLET | Freq: Four times a day (QID) | ORAL | 0 refills | Status: DC | PRN

## 2020-06-12 NOTE — Discharge Instructions (Addendum)
You have a shoulder fracture and need to follow up with Dr. Stann Mainland with [facility]. He is on call, make sure you contact their office first thing tomorrow.   Use hydrocodone for severe pain that is not helped by your regular pain medicine.

## 2020-06-12 NOTE — ED Triage Notes (Signed)
Pt tripped on door seal at home today and fell onto Rt shoulder.

## 2020-06-12 NOTE — ED Provider Notes (Signed)
[place]   MRN: [id number] DOB: [birth year]  Subjective:   [name] is a 75 y.o. female presenting for suffering a right shoulder injury today.  Patient was trying to walk through the garage door and ended up tripping.  She fell turning and landing on her right arm and shoulder.  Denies head injury, loss consciousness, confusion, headache.  She has had severe right shoulder pain with significant difficulty moving her arm at all.  She can still feel her forearm and hand.  Has not taken any medications for her symptoms.  Patient does have difficulty with side effects to Tylenol and codeine.  She tries to use NSAIDs at home including ibuprofen and Advil for pain relief.  No current facility-administered medications for this encounter.  Current Outpatient Medications:  .  dexamethasone (DECADRON) 2 MG tablet, Take 2 tablets once daily for 3 days, then 1 tablet once daily for 3 days, then STOP., Disp: 9 tablet, Rfl: 0 .  famotidine (PEPCID) 20 MG tablet, Take 1 tablet (20 mg total) by mouth daily for 10 days., Disp: 10 tablet, Rfl: 0 .  glimepiride (AMARYL) 4 MG tablet, Take 4 mg by mouth daily with breakfast., Disp: , Rfl:  .  insulin glargine (LANTUS) 100 UNIT/ML injection, Inject 0.08 mLs (8 Units total) into the skin at bedtime., Disp: 10 mL, Rfl: 11 .  metFORMIN (GLUCOPHAGE) 1000 MG tablet, Take 1 tablet (1,000 mg total) by mouth 2 (two) times daily., Disp: , Rfl:  .  Multiple Vitamin (MULTIVITAMIN WITH MINERALS) TABS tablet, Take 1 tablet by mouth daily., Disp: , Rfl:  .  Multiple Vitamins-Minerals (ICAPS AREDS 2 PO), Take 1 capsule by mouth 2 (two) times daily., Disp: , Rfl:  .  rosuvastatin (CRESTOR) 20 MG tablet, Take 20 mg by mouth daily., Disp: , Rfl:  .  sertraline (ZOLOFT) 50 MG tablet, Take 50 mg by mouth daily., Disp: , Rfl:  .  timolol (TIMOPTIC) 0.5 % ophthalmic solution, Place 1 drop into both eyes daily., Disp: , Rfl:  .  traZODone (DESYREL) 50 MG  tablet, Take 1 tablet (50 mg total) by mouth at bedtime., Disp: , Rfl:    Allergies  Allergen Reactions  . Codeine Nausea And Vomiting  . Penicillins Swelling    Childhood reaction  . Sulfa Antibiotics Swelling    Childhood reaction  . Tylenol [Acetaminophen] Nausea And Vomiting    Past Medical History:  Diagnosis Date  . Depression   . Diabetes mellitus without complication ([place])   . Glaucoma    bilateral  . Hyperlipidemia   . Hypertension   . Macular degeneration   . Mental disorder   . SVD (spontaneous vaginal delivery)    x 3     Past Surgical History:  Procedure Laterality Date  . ANAL RECTAL MANOMETRY N/A 02/12/2014   Procedure: ANO RECTAL MANOMETRY;  Surgeon: Leighton Ruff, MD;  Location: [facility];  Service: Endoscopy;  Laterality: N/A;  . APPENDECTOMY    . CYST EXCISION  09/15/2016   FACIAL CYST   . EYE SURGERY     right eye   . EYE SURGERY     bilateral cataract eye surgery  . RECTOCELE REPAIR N/A 03/02/2013   Procedure: POSTERIOR REPAIR (RECTOCELE);  Surgeon: Margarette Asal, MD;  Location: [facility];  Service: Gynecology;  Laterality: N/A;  . TONSILLECTOMY    . TOTAL VAGINAL HYSTERECTOMY  2009  . VAGINAL HYSTERECTOMY  2009   TOT done as well  Family History  Problem Relation Age of Onset  . Hypertension Other     Social History   Tobacco Use  . Smoking status: Former Smoker    Packs/day: 1.50    Years: 45.00    Pack years: 67.50    Types: Cigarettes  . Smokeless tobacco: Never Used  Vaping Use  . Vaping Use: Never used  Substance Use Topics  . Alcohol use: No  . Drug use: No    ROS   Objective:   Vitals: BP (!) 156/82 (BP Location: Left Arm)   Pulse 75   Temp 98.2 F (36.8 C) (Oral)   Resp 16   SpO2 95%   Physical Exam Constitutional:      General: She is not in acute distress.    Appearance: Normal appearance. She is well-developed. She is not ill-appearing, toxic-appearing or diaphoretic.  HENT:     Head:  Normocephalic and atraumatic.     Nose: Nose normal.     Mouth/Throat:     Mouth: Mucous membranes are moist.     Pharynx: Oropharynx is clear.  Eyes:     General: No scleral icterus.    Extraocular Movements: Extraocular movements intact.     Pupils: Pupils are equal, round, and reactive to light.  Cardiovascular:     Rate and Rhythm: Normal rate.  Pulmonary:     Effort: Pulmonary effort is normal.  Musculoskeletal:     Right shoulder: Swelling, tenderness, bony tenderness and crepitus present. No deformity, effusion or laceration. Decreased range of motion. Decreased strength. Normal pulse.     Right upper arm: No swelling, edema, deformity, lacerations, tenderness or bony tenderness.     Right elbow: No swelling, deformity, effusion or lacerations. Normal range of motion. No tenderness.     Right forearm: No swelling, edema, deformity, lacerations, tenderness or bony tenderness.     Comments: No scapular tenderness, no clavicular tenderness.  Symptoms confined mostly to the right shoulder.  No ecchymosis.  Skin:    General: Skin is warm and dry.  Neurological:     General: No focal deficit present.     Mental Status: She is alert and oriented to person, place, and time.     Cranial Nerves: No cranial nerve deficit.     Motor: No weakness.     Coordination: Coordination normal.     Gait: Gait normal.     Deep Tendon Reflexes: Reflexes normal.  Psychiatric:        Mood and Affect: Mood normal.        Behavior: Behavior normal.        Thought Content: Thought content normal.        Judgment: Judgment normal.     DG Shoulder Right  Result Date: 06/12/2020 CLINICAL DATA:  Fall EXAM: RIGHT SHOULDER - 2+ VIEW COMPARISON:  None. FINDINGS: Comminuted fracture of the right humeral head.  No dislocation. IMPRESSION: Comminuted fracture of the right humeral head. Electronically Signed   By: Ulyses Jarred M.D.   On: 06/12/2020 20:19     Assessment and Plan :   PDMP not reviewed  this encounter.  1. Fall, initial encounter   2. Acute pain of right shoulder   3. Closed fracture of right shoulder, initial encounter     X-ray images reviewed with Dr. Mannie Stabile.  Will place patient in a shoulder immobilizer and have her follow-up closely with Dr. Stann Mainland, the orthopedist on-call.  Patient given hydrocodone in clinic for severe pain.  Recommended  scheduling her regular pain medicine using hydrocodone only for breakthrough pain.  Patient is neurovascularly intact and therefore we will hold off on sending her to the emergency room. Counseled patient on potential for adverse effects with medications prescribed/recommended today, ER and return-to-clinic precautions discussed, patient verbalized understanding.    Jaynee Eagles, [place] 06/13/20 [phone]

## 2020-06-12 NOTE — ED Notes (Signed)
Right shoulder pain after a fall this evening.  Right pedal pulse is 2 +.  Patient has poor vision and tripped.  No loc.  Spoke to dr hagler

## 2020-06-19 DIAGNOSIS — S42201A Unspecified fracture of upper end of right humerus, initial encounter for closed fracture: Secondary | ICD-10-CM | POA: Diagnosis not present

## 2020-06-26 DIAGNOSIS — S42201D Unspecified fracture of upper end of right humerus, subsequent encounter for fracture with routine healing: Secondary | ICD-10-CM | POA: Diagnosis not present

## 2020-07-10 DIAGNOSIS — E119 Type 2 diabetes mellitus without complications: Secondary | ICD-10-CM | POA: Diagnosis not present

## 2020-07-10 DIAGNOSIS — I10 Essential (primary) hypertension: Secondary | ICD-10-CM | POA: Diagnosis not present

## 2020-07-10 DIAGNOSIS — N289 Disorder of kidney and ureter, unspecified: Secondary | ICD-10-CM | POA: Diagnosis not present

## 2020-07-10 DIAGNOSIS — H3530 Unspecified macular degeneration: Secondary | ICD-10-CM | POA: Diagnosis not present

## 2020-07-10 DIAGNOSIS — Z7984 Long term (current) use of oral hypoglycemic drugs: Secondary | ICD-10-CM | POA: Diagnosis not present

## 2020-07-10 DIAGNOSIS — Z9181 History of falling: Secondary | ICD-10-CM | POA: Diagnosis not present

## 2020-07-10 DIAGNOSIS — S42201D Unspecified fracture of upper end of right humerus, subsequent encounter for fracture with routine healing: Secondary | ICD-10-CM | POA: Diagnosis not present

## 2020-07-17 DIAGNOSIS — Z9181 History of falling: Secondary | ICD-10-CM | POA: Diagnosis not present

## 2020-07-17 DIAGNOSIS — S42201D Unspecified fracture of upper end of right humerus, subsequent encounter for fracture with routine healing: Secondary | ICD-10-CM | POA: Diagnosis not present

## 2020-07-17 DIAGNOSIS — E119 Type 2 diabetes mellitus without complications: Secondary | ICD-10-CM | POA: Diagnosis not present

## 2020-07-17 DIAGNOSIS — I10 Essential (primary) hypertension: Secondary | ICD-10-CM | POA: Diagnosis not present

## 2020-07-17 DIAGNOSIS — N289 Disorder of kidney and ureter, unspecified: Secondary | ICD-10-CM | POA: Diagnosis not present

## 2020-07-17 DIAGNOSIS — H3530 Unspecified macular degeneration: Secondary | ICD-10-CM | POA: Diagnosis not present

## 2020-07-22 DIAGNOSIS — Z9181 History of falling: Secondary | ICD-10-CM | POA: Diagnosis not present

## 2020-07-22 DIAGNOSIS — S42201D Unspecified fracture of upper end of right humerus, subsequent encounter for fracture with routine healing: Secondary | ICD-10-CM | POA: Diagnosis not present

## 2020-07-22 DIAGNOSIS — H3530 Unspecified macular degeneration: Secondary | ICD-10-CM | POA: Diagnosis not present

## 2020-07-22 DIAGNOSIS — N289 Disorder of kidney and ureter, unspecified: Secondary | ICD-10-CM | POA: Diagnosis not present

## 2020-07-22 DIAGNOSIS — E119 Type 2 diabetes mellitus without complications: Secondary | ICD-10-CM | POA: Diagnosis not present

## 2020-07-22 DIAGNOSIS — I10 Essential (primary) hypertension: Secondary | ICD-10-CM | POA: Diagnosis not present

## 2020-07-24 DIAGNOSIS — E119 Type 2 diabetes mellitus without complications: Secondary | ICD-10-CM | POA: Diagnosis not present

## 2020-07-24 DIAGNOSIS — I10 Essential (primary) hypertension: Secondary | ICD-10-CM | POA: Diagnosis not present

## 2020-07-24 DIAGNOSIS — S42201S Unspecified fracture of upper end of right humerus, sequela: Secondary | ICD-10-CM | POA: Diagnosis not present

## 2020-07-24 DIAGNOSIS — H3530 Unspecified macular degeneration: Secondary | ICD-10-CM | POA: Diagnosis not present

## 2020-07-24 DIAGNOSIS — N289 Disorder of kidney and ureter, unspecified: Secondary | ICD-10-CM | POA: Diagnosis not present

## 2020-07-24 DIAGNOSIS — S42201D Unspecified fracture of upper end of right humerus, subsequent encounter for fracture with routine healing: Secondary | ICD-10-CM | POA: Diagnosis not present

## 2020-07-24 DIAGNOSIS — Z9181 History of falling: Secondary | ICD-10-CM | POA: Diagnosis not present

## 2020-07-26 DIAGNOSIS — I10 Essential (primary) hypertension: Secondary | ICD-10-CM | POA: Diagnosis not present

## 2020-07-26 DIAGNOSIS — H3530 Unspecified macular degeneration: Secondary | ICD-10-CM | POA: Diagnosis not present

## 2020-07-26 DIAGNOSIS — S42201D Unspecified fracture of upper end of right humerus, subsequent encounter for fracture with routine healing: Secondary | ICD-10-CM | POA: Diagnosis not present

## 2020-07-26 DIAGNOSIS — Z9181 History of falling: Secondary | ICD-10-CM | POA: Diagnosis not present

## 2020-07-26 DIAGNOSIS — E119 Type 2 diabetes mellitus without complications: Secondary | ICD-10-CM | POA: Diagnosis not present

## 2020-07-26 DIAGNOSIS — N289 Disorder of kidney and ureter, unspecified: Secondary | ICD-10-CM | POA: Diagnosis not present

## 2020-07-30 DIAGNOSIS — M150 Primary generalized (osteo)arthritis: Secondary | ICD-10-CM | POA: Diagnosis not present

## 2020-07-30 DIAGNOSIS — F320 Major depressive disorder, single episode, mild: Secondary | ICD-10-CM | POA: Diagnosis not present

## 2020-07-30 DIAGNOSIS — E1169 Type 2 diabetes mellitus with other specified complication: Secondary | ICD-10-CM | POA: Diagnosis not present

## 2020-07-30 DIAGNOSIS — E7800 Pure hypercholesterolemia, unspecified: Secondary | ICD-10-CM | POA: Diagnosis not present

## 2020-07-30 DIAGNOSIS — I10 Essential (primary) hypertension: Secondary | ICD-10-CM | POA: Diagnosis not present

## 2020-07-31 DIAGNOSIS — I10 Essential (primary) hypertension: Secondary | ICD-10-CM | POA: Diagnosis not present

## 2020-07-31 DIAGNOSIS — S42201D Unspecified fracture of upper end of right humerus, subsequent encounter for fracture with routine healing: Secondary | ICD-10-CM | POA: Diagnosis not present

## 2020-07-31 DIAGNOSIS — N289 Disorder of kidney and ureter, unspecified: Secondary | ICD-10-CM | POA: Diagnosis not present

## 2020-07-31 DIAGNOSIS — Z9181 History of falling: Secondary | ICD-10-CM | POA: Diagnosis not present

## 2020-07-31 DIAGNOSIS — H3530 Unspecified macular degeneration: Secondary | ICD-10-CM | POA: Diagnosis not present

## 2020-07-31 DIAGNOSIS — E119 Type 2 diabetes mellitus without complications: Secondary | ICD-10-CM | POA: Diagnosis not present

## 2020-08-02 DIAGNOSIS — Z9181 History of falling: Secondary | ICD-10-CM | POA: Diagnosis not present

## 2020-08-02 DIAGNOSIS — E119 Type 2 diabetes mellitus without complications: Secondary | ICD-10-CM | POA: Diagnosis not present

## 2020-08-02 DIAGNOSIS — H3530 Unspecified macular degeneration: Secondary | ICD-10-CM | POA: Diagnosis not present

## 2020-08-02 DIAGNOSIS — I10 Essential (primary) hypertension: Secondary | ICD-10-CM | POA: Diagnosis not present

## 2020-08-02 DIAGNOSIS — S42201D Unspecified fracture of upper end of right humerus, subsequent encounter for fracture with routine healing: Secondary | ICD-10-CM | POA: Diagnosis not present

## 2020-08-02 DIAGNOSIS — N289 Disorder of kidney and ureter, unspecified: Secondary | ICD-10-CM | POA: Diagnosis not present

## 2020-08-06 DIAGNOSIS — I10 Essential (primary) hypertension: Secondary | ICD-10-CM | POA: Diagnosis not present

## 2020-08-06 DIAGNOSIS — S42201D Unspecified fracture of upper end of right humerus, subsequent encounter for fracture with routine healing: Secondary | ICD-10-CM | POA: Diagnosis not present

## 2020-08-06 DIAGNOSIS — E119 Type 2 diabetes mellitus without complications: Secondary | ICD-10-CM | POA: Diagnosis not present

## 2020-08-06 DIAGNOSIS — Z9181 History of falling: Secondary | ICD-10-CM | POA: Diagnosis not present

## 2020-08-06 DIAGNOSIS — H3530 Unspecified macular degeneration: Secondary | ICD-10-CM | POA: Diagnosis not present

## 2020-08-06 DIAGNOSIS — N289 Disorder of kidney and ureter, unspecified: Secondary | ICD-10-CM | POA: Diagnosis not present

## 2020-08-08 DIAGNOSIS — E119 Type 2 diabetes mellitus without complications: Secondary | ICD-10-CM | POA: Diagnosis not present

## 2020-08-08 DIAGNOSIS — S42201D Unspecified fracture of upper end of right humerus, subsequent encounter for fracture with routine healing: Secondary | ICD-10-CM | POA: Diagnosis not present

## 2020-08-08 DIAGNOSIS — H3530 Unspecified macular degeneration: Secondary | ICD-10-CM | POA: Diagnosis not present

## 2020-08-08 DIAGNOSIS — I10 Essential (primary) hypertension: Secondary | ICD-10-CM | POA: Diagnosis not present

## 2020-08-08 DIAGNOSIS — N289 Disorder of kidney and ureter, unspecified: Secondary | ICD-10-CM | POA: Diagnosis not present

## 2020-08-08 DIAGNOSIS — Z9181 History of falling: Secondary | ICD-10-CM | POA: Diagnosis not present

## 2020-08-09 DIAGNOSIS — Z9181 History of falling: Secondary | ICD-10-CM | POA: Diagnosis not present

## 2020-08-09 DIAGNOSIS — E119 Type 2 diabetes mellitus without complications: Secondary | ICD-10-CM | POA: Diagnosis not present

## 2020-08-09 DIAGNOSIS — I10 Essential (primary) hypertension: Secondary | ICD-10-CM | POA: Diagnosis not present

## 2020-08-09 DIAGNOSIS — N289 Disorder of kidney and ureter, unspecified: Secondary | ICD-10-CM | POA: Diagnosis not present

## 2020-08-09 DIAGNOSIS — S42201D Unspecified fracture of upper end of right humerus, subsequent encounter for fracture with routine healing: Secondary | ICD-10-CM | POA: Diagnosis not present

## 2020-08-09 DIAGNOSIS — Z7984 Long term (current) use of oral hypoglycemic drugs: Secondary | ICD-10-CM | POA: Diagnosis not present

## 2020-08-09 DIAGNOSIS — H3530 Unspecified macular degeneration: Secondary | ICD-10-CM | POA: Diagnosis not present

## 2020-08-12 DIAGNOSIS — N289 Disorder of kidney and ureter, unspecified: Secondary | ICD-10-CM | POA: Diagnosis not present

## 2020-08-12 DIAGNOSIS — H3530 Unspecified macular degeneration: Secondary | ICD-10-CM | POA: Diagnosis not present

## 2020-08-12 DIAGNOSIS — S42201D Unspecified fracture of upper end of right humerus, subsequent encounter for fracture with routine healing: Secondary | ICD-10-CM | POA: Diagnosis not present

## 2020-08-12 DIAGNOSIS — E119 Type 2 diabetes mellitus without complications: Secondary | ICD-10-CM | POA: Diagnosis not present

## 2020-08-12 DIAGNOSIS — Z9181 History of falling: Secondary | ICD-10-CM | POA: Diagnosis not present

## 2020-08-12 DIAGNOSIS — I10 Essential (primary) hypertension: Secondary | ICD-10-CM | POA: Diagnosis not present

## 2020-08-16 DIAGNOSIS — E119 Type 2 diabetes mellitus without complications: Secondary | ICD-10-CM | POA: Diagnosis not present

## 2020-08-16 DIAGNOSIS — I10 Essential (primary) hypertension: Secondary | ICD-10-CM | POA: Diagnosis not present

## 2020-08-16 DIAGNOSIS — N289 Disorder of kidney and ureter, unspecified: Secondary | ICD-10-CM | POA: Diagnosis not present

## 2020-08-16 DIAGNOSIS — Z9181 History of falling: Secondary | ICD-10-CM | POA: Diagnosis not present

## 2020-08-16 DIAGNOSIS — H3530 Unspecified macular degeneration: Secondary | ICD-10-CM | POA: Diagnosis not present

## 2020-08-16 DIAGNOSIS — S42201D Unspecified fracture of upper end of right humerus, subsequent encounter for fracture with routine healing: Secondary | ICD-10-CM | POA: Diagnosis not present

## 2020-08-19 DIAGNOSIS — S42201D Unspecified fracture of upper end of right humerus, subsequent encounter for fracture with routine healing: Secondary | ICD-10-CM | POA: Diagnosis not present

## 2020-08-19 DIAGNOSIS — Z9181 History of falling: Secondary | ICD-10-CM | POA: Diagnosis not present

## 2020-08-19 DIAGNOSIS — I10 Essential (primary) hypertension: Secondary | ICD-10-CM | POA: Diagnosis not present

## 2020-08-19 DIAGNOSIS — N289 Disorder of kidney and ureter, unspecified: Secondary | ICD-10-CM | POA: Diagnosis not present

## 2020-08-19 DIAGNOSIS — E119 Type 2 diabetes mellitus without complications: Secondary | ICD-10-CM | POA: Diagnosis not present

## 2020-08-19 DIAGNOSIS — H3530 Unspecified macular degeneration: Secondary | ICD-10-CM | POA: Diagnosis not present

## 2020-08-21 DIAGNOSIS — I10 Essential (primary) hypertension: Secondary | ICD-10-CM | POA: Diagnosis not present

## 2020-08-21 DIAGNOSIS — N289 Disorder of kidney and ureter, unspecified: Secondary | ICD-10-CM | POA: Diagnosis not present

## 2020-08-21 DIAGNOSIS — E119 Type 2 diabetes mellitus without complications: Secondary | ICD-10-CM | POA: Diagnosis not present

## 2020-08-21 DIAGNOSIS — Z9181 History of falling: Secondary | ICD-10-CM | POA: Diagnosis not present

## 2020-08-21 DIAGNOSIS — H3530 Unspecified macular degeneration: Secondary | ICD-10-CM | POA: Diagnosis not present

## 2020-08-21 DIAGNOSIS — S42201D Unspecified fracture of upper end of right humerus, subsequent encounter for fracture with routine healing: Secondary | ICD-10-CM | POA: Diagnosis not present

## 2020-08-23 DIAGNOSIS — S42201S Unspecified fracture of upper end of right humerus, sequela: Secondary | ICD-10-CM | POA: Diagnosis not present

## 2020-09-06 DIAGNOSIS — H353134 Nonexudative age-related macular degeneration, bilateral, advanced atrophic with subfoveal involvement: Secondary | ICD-10-CM | POA: Diagnosis not present

## 2020-09-06 DIAGNOSIS — Z961 Presence of intraocular lens: Secondary | ICD-10-CM | POA: Diagnosis not present

## 2020-09-06 DIAGNOSIS — H35341 Macular cyst, hole, or pseudohole, right eye: Secondary | ICD-10-CM | POA: Diagnosis not present

## 2020-09-06 DIAGNOSIS — H401134 Primary open-angle glaucoma, bilateral, indeterminate stage: Secondary | ICD-10-CM | POA: Diagnosis not present

## 2020-09-06 DIAGNOSIS — E119 Type 2 diabetes mellitus without complications: Secondary | ICD-10-CM | POA: Diagnosis not present

## 2020-09-10 DIAGNOSIS — M25511 Pain in right shoulder: Secondary | ICD-10-CM | POA: Diagnosis not present

## 2020-09-12 DIAGNOSIS — G5602 Carpal tunnel syndrome, left upper limb: Secondary | ICD-10-CM | POA: Diagnosis not present

## 2020-09-12 DIAGNOSIS — M654 Radial styloid tenosynovitis [de Quervain]: Secondary | ICD-10-CM | POA: Diagnosis not present

## 2020-09-12 DIAGNOSIS — M65332 Trigger finger, left middle finger: Secondary | ICD-10-CM | POA: Diagnosis not present

## 2020-09-25 DIAGNOSIS — M25511 Pain in right shoulder: Secondary | ICD-10-CM | POA: Diagnosis not present

## 2020-10-01 DIAGNOSIS — M25511 Pain in right shoulder: Secondary | ICD-10-CM | POA: Diagnosis not present

## 2020-10-04 DIAGNOSIS — S42201A Unspecified fracture of upper end of right humerus, initial encounter for closed fracture: Secondary | ICD-10-CM | POA: Diagnosis not present

## 2020-10-09 IMAGING — CR DG CHEST 2V
2 series · 2 of 2 positions shown · non-contrast
Comparison: 09/24/2016

CLINICAL DATA: Cough, weakness

EXAM:
CHEST - 2 VIEW

[w chest lat]
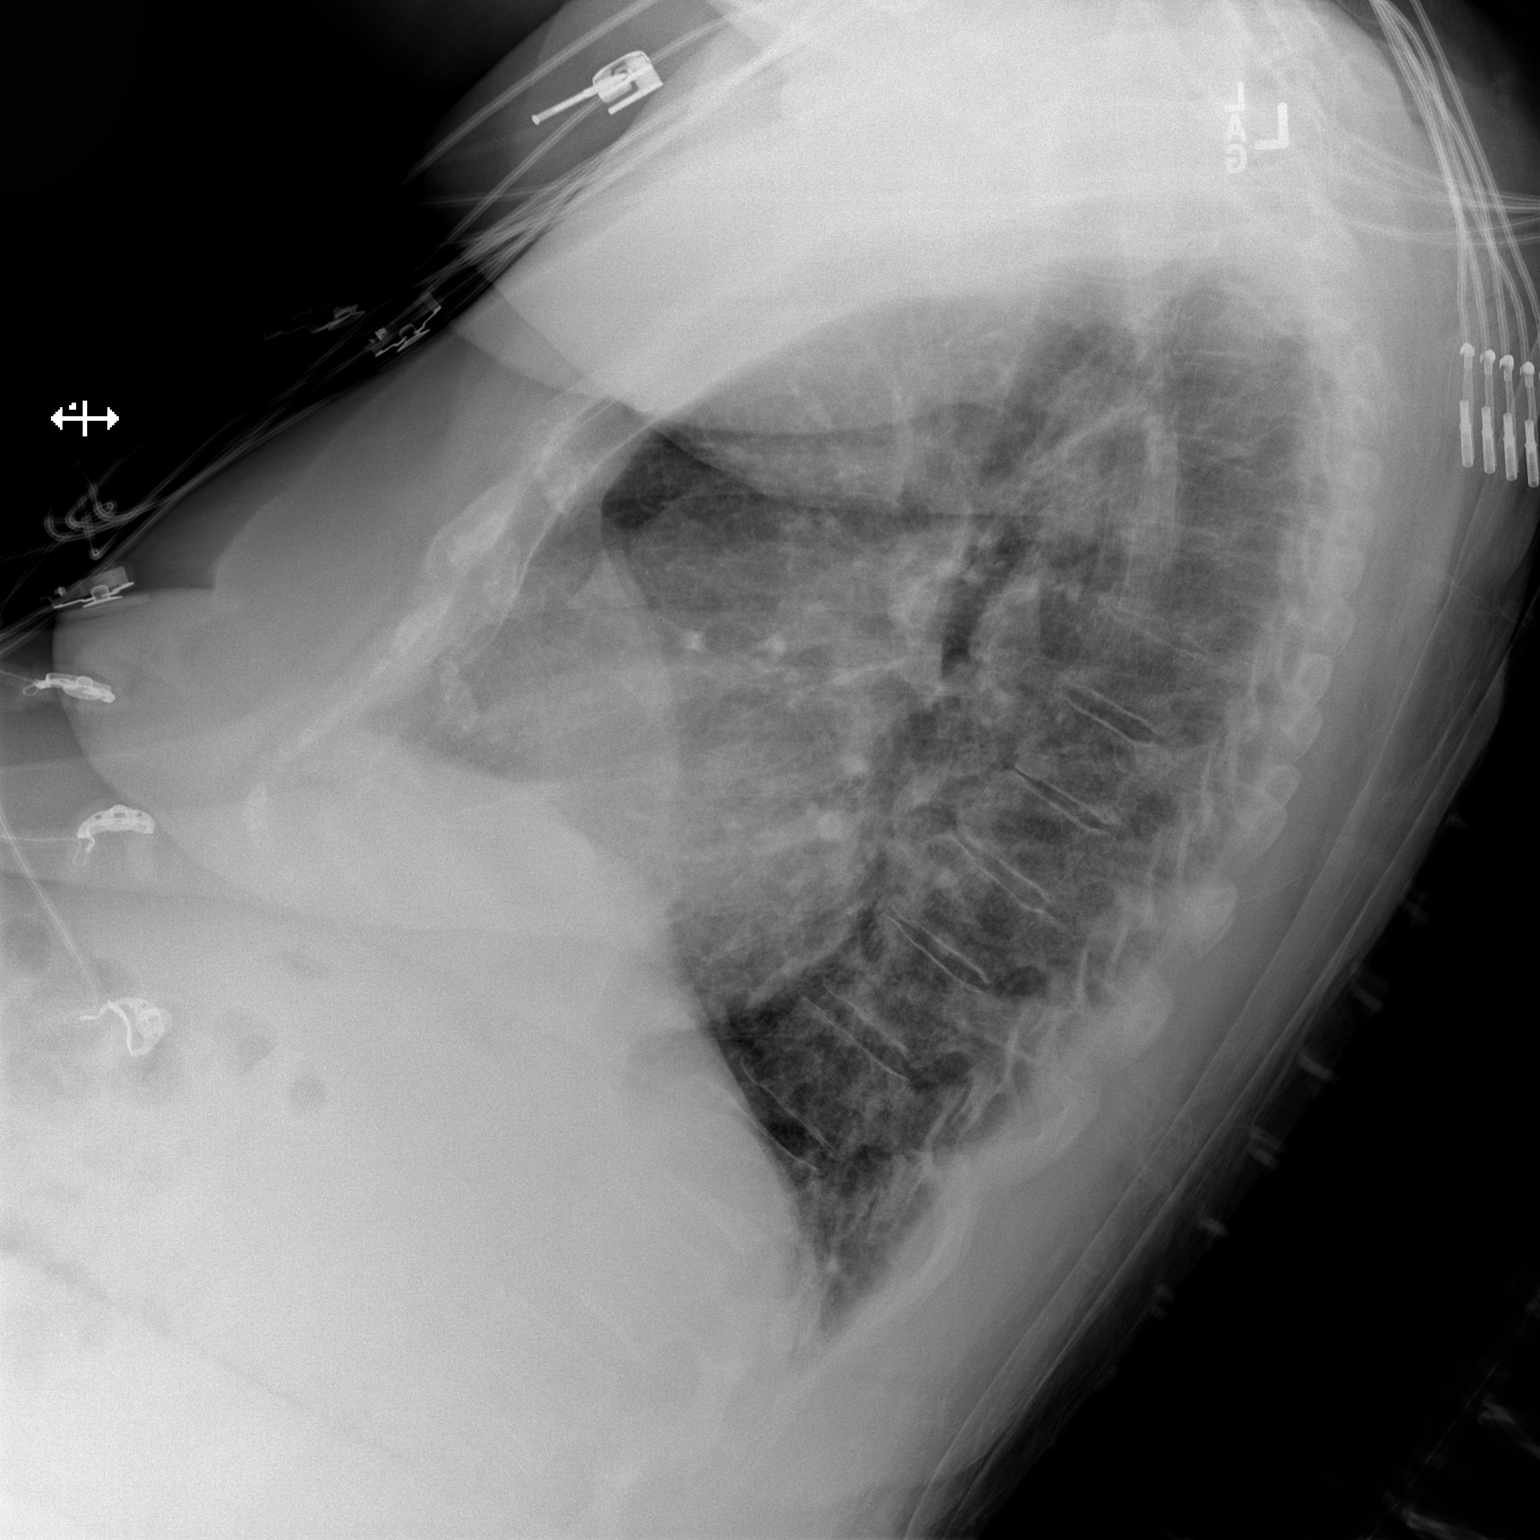

[x chest ap]
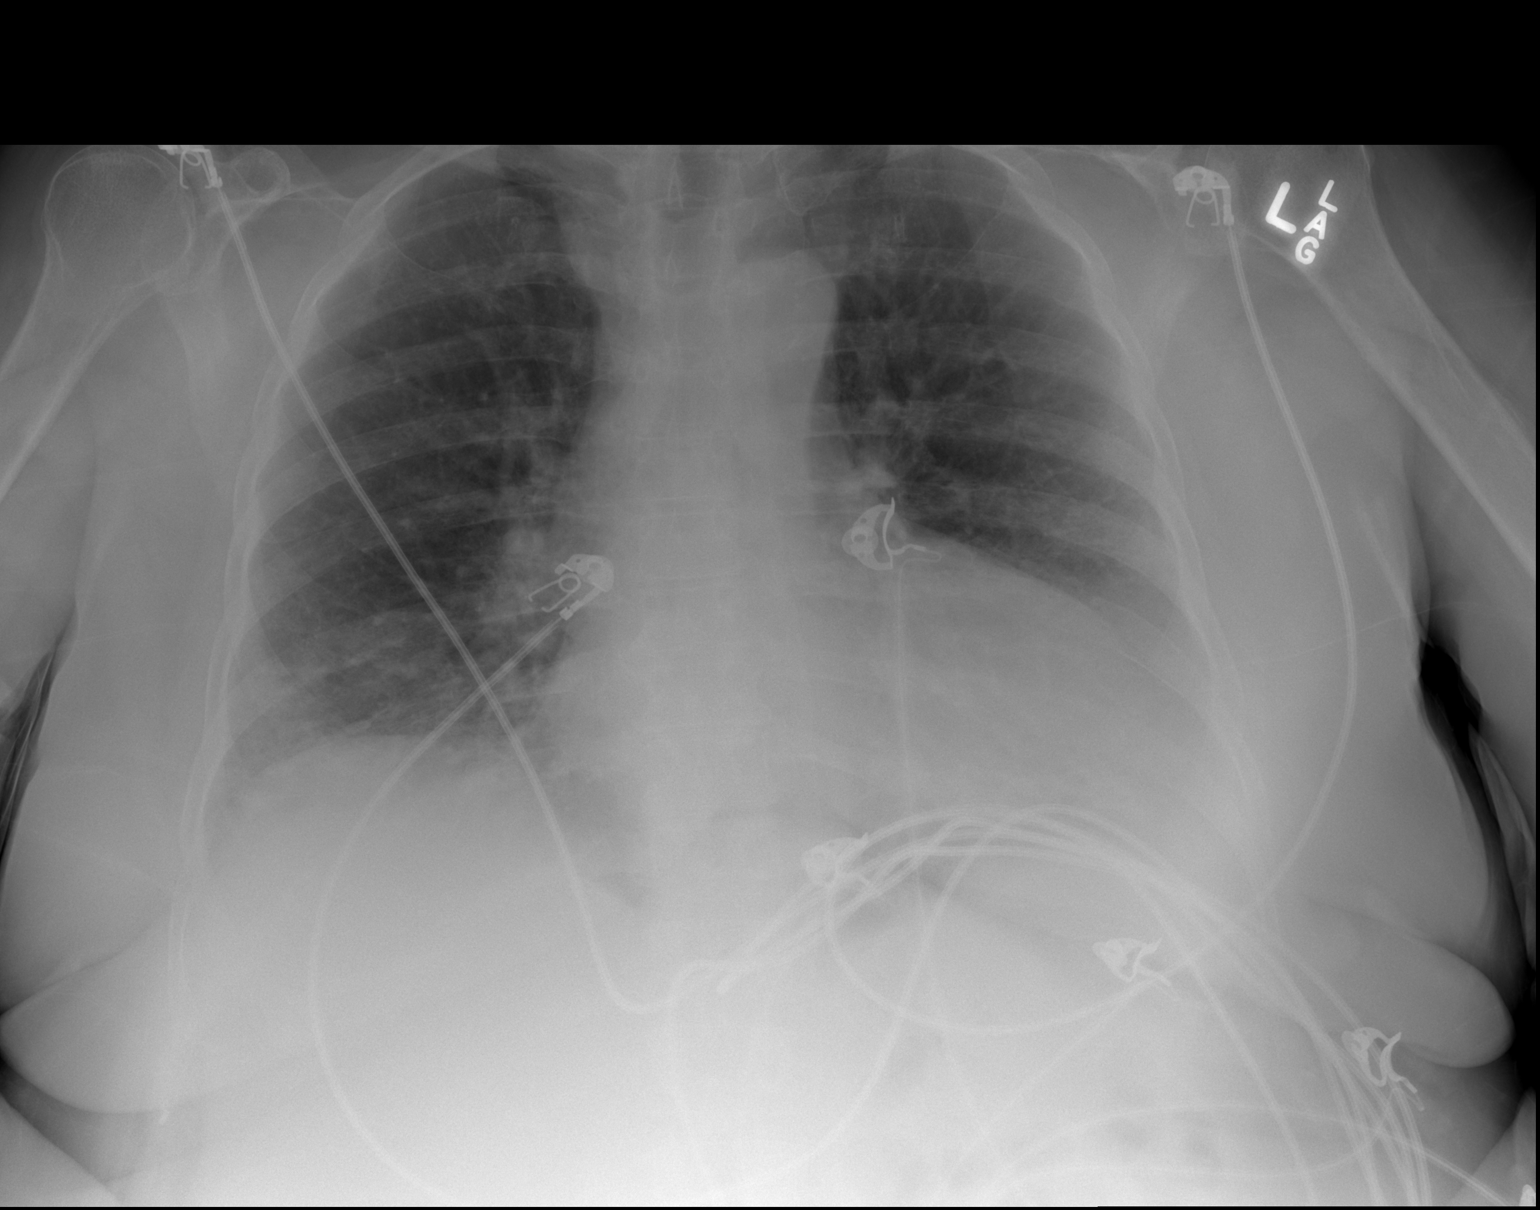

[2 of 2 positions shown; findings below may reference images not displayed]

FINDINGS: Cardiomegaly. Low lung volumes with bibasilar atelectasis. No overt
edema. No effusions. No acute bony abnormality.
IMPRESSION: Cardiomegaly.  Bibasilar atelectasis.

## 2020-10-10 DIAGNOSIS — M654 Radial styloid tenosynovitis [de Quervain]: Secondary | ICD-10-CM | POA: Diagnosis not present

## 2020-10-10 DIAGNOSIS — M65332 Trigger finger, left middle finger: Secondary | ICD-10-CM | POA: Diagnosis not present

## 2020-10-10 DIAGNOSIS — G5602 Carpal tunnel syndrome, left upper limb: Secondary | ICD-10-CM | POA: Diagnosis not present

## 2020-10-12 IMAGING — DX DG CHEST 1V PORT
1 series · 1 of 1 positions shown · non-contrast
Comparison: Chest radiograph 02/23/2019

CLINICAL DATA: Hypoxia

EXAM:
PORTABLE CHEST 1 VIEW

[chest]
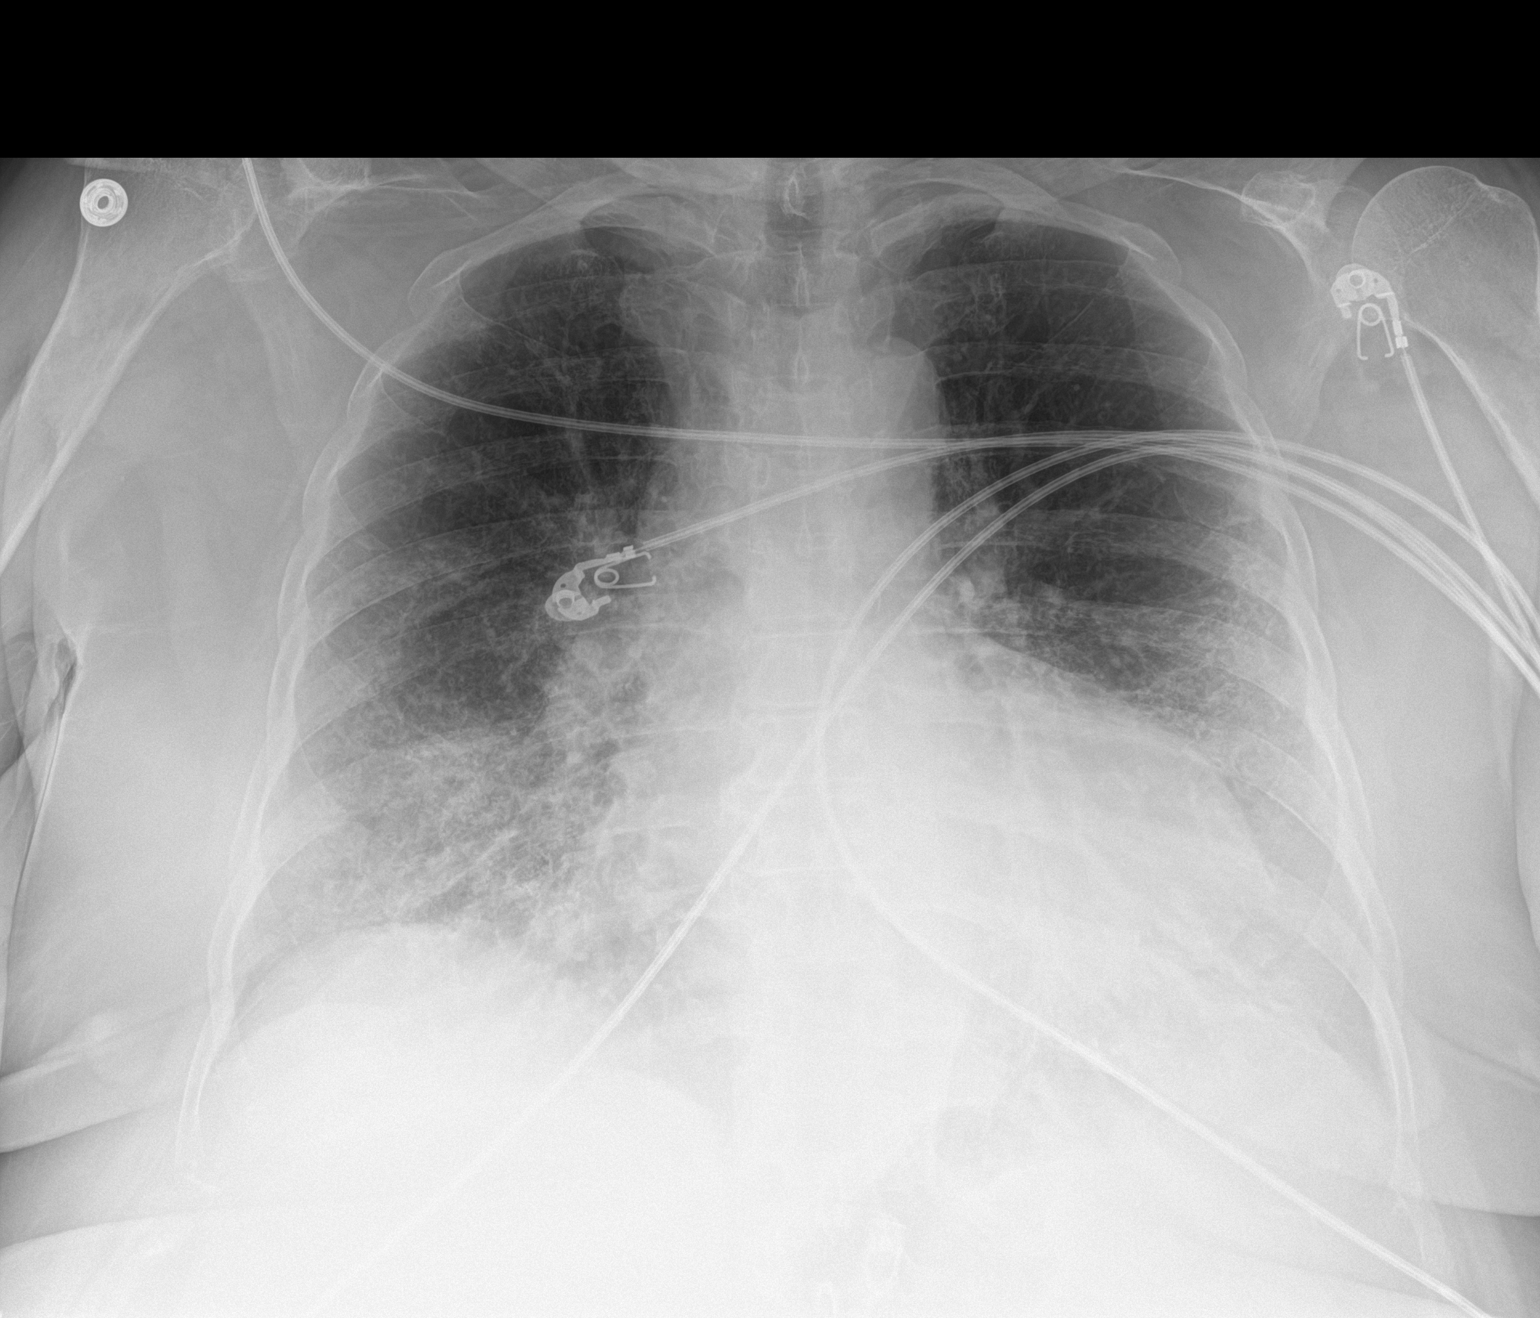

[1 of 1 positions shown; findings below may reference images not displayed]

FINDINGS: Monitoring leads overlie the patient. Stable cardiomegaly. Interval
increase in bilateral mid lower lung heterogeneous opacities. No
pleural effusion or pneumothorax. Thoracic spine degenerative
changes.
IMPRESSION: Cardiomegaly.

Increasing bilateral mid lower lung heterogeneous opacities which
may represent edema, pneumonia or atypical viral infection.

## 2020-10-14 DIAGNOSIS — Z7689 Persons encountering health services in other specified circumstances: Secondary | ICD-10-CM | POA: Diagnosis not present

## 2020-10-14 DIAGNOSIS — Z1231 Encounter for screening mammogram for malignant neoplasm of breast: Secondary | ICD-10-CM | POA: Diagnosis not present

## 2020-10-14 DIAGNOSIS — Z6841 Body Mass Index (BMI) 40.0 and over, adult: Secondary | ICD-10-CM | POA: Diagnosis not present

## 2020-10-14 DIAGNOSIS — Z01419 Encounter for gynecological examination (general) (routine) without abnormal findings: Secondary | ICD-10-CM | POA: Diagnosis not present

## 2020-11-15 DIAGNOSIS — S42201S Unspecified fracture of upper end of right humerus, sequela: Secondary | ICD-10-CM | POA: Diagnosis not present

## 2021-02-14 DIAGNOSIS — E1169 Type 2 diabetes mellitus with other specified complication: Secondary | ICD-10-CM | POA: Diagnosis not present

## 2021-02-14 DIAGNOSIS — Z23 Encounter for immunization: Secondary | ICD-10-CM | POA: Diagnosis not present

## 2021-02-14 DIAGNOSIS — Z0000 Encounter for general adult medical examination without abnormal findings: Secondary | ICD-10-CM | POA: Diagnosis not present

## 2021-02-14 DIAGNOSIS — F320 Major depressive disorder, single episode, mild: Secondary | ICD-10-CM | POA: Diagnosis not present

## 2021-02-14 DIAGNOSIS — I10 Essential (primary) hypertension: Secondary | ICD-10-CM | POA: Diagnosis not present

## 2021-02-14 DIAGNOSIS — M150 Primary generalized (osteo)arthritis: Secondary | ICD-10-CM | POA: Diagnosis not present

## 2021-02-14 DIAGNOSIS — E7800 Pure hypercholesterolemia, unspecified: Secondary | ICD-10-CM | POA: Diagnosis not present

## 2021-04-03 ENCOUNTER — Emergency Department (HOSPITAL_COMMUNITY): Payer: Medicare Other

## 2021-04-03 ENCOUNTER — Encounter (HOSPITAL_COMMUNITY): Payer: Self-pay

## 2021-04-03 ENCOUNTER — Emergency Department (HOSPITAL_COMMUNITY)
Admission: EM | Admit: 2021-04-03 | Discharge: 2021-04-04 | Disposition: A | Discharge status: Home / Self Care | Payer: Medicare Other | Attending: Emergency Medicine | Admitting: Emergency Medicine

## 2021-04-03 DIAGNOSIS — R0602 Shortness of breath: Secondary | ICD-10-CM

## 2021-04-03 DIAGNOSIS — Z7984 Long term (current) use of oral hypoglycemic drugs: Secondary | ICD-10-CM | POA: Diagnosis not present

## 2021-04-03 DIAGNOSIS — U071 COVID-19: Secondary | ICD-10-CM | POA: Diagnosis not present

## 2021-04-03 DIAGNOSIS — I10 Essential (primary) hypertension: Secondary | ICD-10-CM | POA: Diagnosis not present

## 2021-04-03 DIAGNOSIS — E119 Type 2 diabetes mellitus without complications: Secondary | ICD-10-CM | POA: Diagnosis not present

## 2021-04-03 DIAGNOSIS — I517 Cardiomegaly: Secondary | ICD-10-CM | POA: Diagnosis not present

## 2021-04-03 DIAGNOSIS — Z79899 Other long term (current) drug therapy: Secondary | ICD-10-CM | POA: Insufficient documentation

## 2021-04-03 DIAGNOSIS — Z794 Long term (current) use of insulin: Secondary | ICD-10-CM | POA: Diagnosis not present

## 2021-04-03 DIAGNOSIS — Z87891 Personal history of nicotine dependence: Secondary | ICD-10-CM | POA: Insufficient documentation

## 2021-04-03 LAB — BASIC METABOLIC PANEL
Anion gap: 11 (ref 5–15)
BUN: 16 mg/dL (ref 8–23)
CO2: 23 mmol/L (ref 22–32)
Calcium: 9.8 mg/dL (ref 8.9–10.3)
Chloride: 102 mmol/L (ref 98–111)
Creatinine, Ser: 1.14 mg/dL — ABNORMAL HIGH (ref 0.44–1.00)
GFR, Estimated: 51 mL/min — ABNORMAL LOW (ref >60)
Glucose, Bld: 98 mg/dL (ref 70–99)
Potassium: 3.3 mmol/L — ABNORMAL LOW (ref 3.5–5.1)
Sodium: 136 mmol/L (ref 135–145)

## 2021-04-03 LAB — CBC WITH DIFFERENTIAL/PLATELET
Abs Immature Granulocytes: 0.04 10*3/uL (ref 0.00–0.07)
Basophils Absolute: 0 10*3/uL (ref 0.0–0.1)
Basophils Relative: 0 %
Eosinophils Absolute: 0 10*3/uL (ref 0.0–0.5)
Eosinophils Relative: 0 %
HCT: 38.5 % (ref 36.0–46.0)
Hemoglobin: 12.5 g/dL (ref 12.0–15.0)
Immature Granulocytes: 0 %
Lymphocytes Relative: 10 %
Lymphs Abs: 0.9 10*3/uL (ref 0.7–4.0)
MCH: 29.8 pg (ref 26.0–34.0)
MCHC: 32.5 g/dL (ref 30.0–36.0)
MCV: 91.9 fL (ref 80.0–100.0)
Monocytes Absolute: 0.9 10*3/uL (ref 0.1–1.0)
Monocytes Relative: 10 %
Neutro Abs: 7.3 10*3/uL (ref 1.7–7.7)
Neutrophils Relative %: 80 %
Platelets: 211 10*3/uL (ref 150–400)
RBC: 4.19 MIL/uL (ref 3.87–5.11)
RDW: 13.3 % (ref 11.5–15.5)
WBC: 9.2 10*3/uL (ref 4.0–10.5)
nRBC: 0 % (ref 0.0–0.2)

## 2021-04-03 LAB — CBG MONITORING, ED: Glucose-Capillary: 115 mg/dL — ABNORMAL HIGH (ref 70–99)

## 2021-04-03 MED ORDER — ALBUTEROL SULFATE HFA 108 (90 BASE) MCG/ACT IN AERS: 2.0000 | INHALATION_SPRAY | RESPIRATORY_TRACT | Status: DC | PRN

## 2021-04-03 NOTE — ED Provider Notes (Signed)
[facility] EMERGENCY DEPARTMENT Provider Note   CSN: [id number] Arrival date & time: 04/03/21  2137     History Chief Complaint  Patient presents with   Shortness of Breath    [name] is a 75 y.o. female.  The history is provided by the patient, a relative and medical records.  Shortness of Breath [name] is a 75 y.o. female who presents to the Emergency Department complaining of sob.  She presents to the ED for the evaluation of sob that started this afternoon that started while she was sleeping.  Feels fatigued.    No associated chest pain, abdominal pain.  Possible fever.  No cough.  No edema.  No N/V/D.  No recent medication changes.  Family members with URI sxs.  No prior similar sxs.  Lives with daughter and son in law.  Daughter states that she started feeling truly poorly on Wednesday.    Past Medical History:  Diagnosis Date   Depression    Diabetes mellitus without complication ([place])    Glaucoma    bilateral   Hyperlipidemia    Hypertension    Macular degeneration    Mental disorder    SVD (spontaneous vaginal delivery)    x 3    Patient Active Problem List   Diagnosis Date Noted   SIRS (systemic inflammatory response syndrome) ([place]) 02/24/2019   ARF (acute renal failure) ([place]) 02/24/2019   Nausea vomiting and diarrhea 02/24/2019   Sepsis ([place]) 09/17/2016   Acute lower UTI 09/17/2016   Uncontrolled type 2 diabetes mellitus with hyperglycemia ([place]) 09/17/2016   Essential hypertension 09/17/2016    Past Surgical History:  Procedure Laterality Date   ANAL RECTAL MANOMETRY N/A 02/12/2014   Procedure: ANO RECTAL MANOMETRY;  Surgeon: Leighton Ruff, MD;  Location: [facility];  Service: Endoscopy;  Laterality: N/A;   APPENDECTOMY     CYST EXCISION  09/15/2016   FACIAL CYST    EYE SURGERY     right eye    EYE SURGERY     bilateral cataract eye surgery   RECTOCELE REPAIR N/A 03/02/2013   Procedure: POSTERIOR REPAIR  (RECTOCELE);  Surgeon: Margarette Asal, MD;  Location: [facility];  Service: Gynecology;  Laterality: N/A;   TONSILLECTOMY     TOTAL VAGINAL HYSTERECTOMY  2009   VAGINAL HYSTERECTOMY  2009   TOT done as well     OB History   No obstetric history on file.     Family History  Problem Relation Age of Onset   Hypertension Other     Social History   Tobacco Use   Smoking status: Former    Packs/day: 1.50    Years: 45.00    Pack years: 67.50    Types: Cigarettes   Smokeless tobacco: Never  Vaping Use   Vaping Use: Never used  Substance Use Topics   Alcohol use: No   Drug use: No    Home Medications Prior to Admission medications   Medication Sig Start Date End Date Taking? Authorizing Provider  dexamethasone (DECADRON) 2 MG tablet Take 2 tablets once daily for 3 days, then 1 tablet once daily for 3 days, then STOP. 03/06/19   Bonnielee Haff, MD  famotidine (PEPCID) 20 MG tablet Take 1 tablet (20 mg total) by mouth daily for 10 days. 03/06/19 03/16/19  Bonnielee Haff, MD  glimepiride (AMARYL) 4 MG tablet Take 4 mg by mouth daily with breakfast.    [provider]  HYDROcodone-acetaminophen (NORCO/VICODIN)  5-325 MG tablet Take 1 tablet by mouth every 6 (six) hours as needed for severe pain. 06/12/20   Jaynee Eagles, PA-C  insulin glargine (LANTUS) 100 UNIT/ML injection Inject 0.08 mLs (8 Units total) into the skin at bedtime. 03/06/19   Bonnielee Haff, MD  metFORMIN (GLUCOPHAGE) 1000 MG tablet Take 1 tablet (1,000 mg total) by mouth 2 (two) times daily. 09/19/16   Thurnell Lose, MD  Multiple Vitamin (MULTIVITAMIN WITH MINERALS) TABS tablet Take 1 tablet by mouth daily.    [provider]  Multiple Vitamins-Minerals (ICAPS AREDS 2 PO) Take 1 capsule by mouth 2 (two) times daily.    [provider]  nirmatrelvir/ritonavir EUA, renal dosing, (PAXLOVID) 10 x 150 MG & 10 x 100MG  TABS Take 2 tablets by mouth 2 (two) times daily for 5 days. Patient GFR is  51 Take nirmatrelvir (150 mg) one tablet twice daily for 5 days and ritonavir (100 mg) one tablet twice daily for 5 days. 04/04/21 04/09/21  Quintella Reichert, MD  rosuvastatin (CRESTOR) 20 MG tablet Take 20 mg by mouth daily.    [provider]  sertraline (ZOLOFT) 50 MG tablet Take 50 mg by mouth daily. 12/08/18   [provider]  timolol (TIMOPTIC) 0.5 % ophthalmic solution Place 1 drop into both eyes daily.    [provider]  traZODone (DESYREL) 50 MG tablet Take 1 tablet (50 mg total) by mouth at bedtime. 03/06/19   Bonnielee Haff, MD    Allergies    Codeine, Penicillins, Sulfa antibiotics, and Tylenol [acetaminophen]  Review of Systems   Review of Systems  Respiratory:  Positive for shortness of breath.   All other systems reviewed and are negative.  Physical Exam Updated Vital Signs BP (!) 106/49    Pulse 76    Temp 98.3 F (36.8 C) (Oral)    Resp 13    SpO2 99%   Physical Exam Vitals and nursing note reviewed.  Constitutional:      Appearance: She is well-developed.  HENT:     Head: Normocephalic and atraumatic.  Cardiovascular:     Rate and Rhythm: Normal rate and regular rhythm.     Heart sounds: No murmur heard. Pulmonary:     Effort: Pulmonary effort is normal. No respiratory distress.     Breath sounds: Normal breath sounds.  Abdominal:     Palpations: Abdomen is soft.     Tenderness: There is no abdominal tenderness. There is no guarding or rebound.  Musculoskeletal:        General: No tenderness.     Comments: 1+ edema to BLE  Skin:    General: Skin is warm and dry.  Neurological:     Mental Status: She is alert and oriented to person, place, and time.  Psychiatric:        Behavior: Behavior normal.    ED Results / Procedures / Treatments   Labs (all labs ordered are listed, but only abnormal results are displayed) Labs Reviewed  RESP PANEL BY RT-PCR (FLU A&B, COVID) ARPGX2 - Abnormal; Notable for the following components:       Result Value   SARS Coronavirus 2 by RT PCR POSITIVE (*)    All other components within normal limits  BASIC METABOLIC PANEL - Abnormal; Notable for the following components:   Potassium 3.3 (*)    Creatinine, Ser 1.14 (*)    GFR, Estimated 51 (*)    All other components within normal limits  BRAIN NATRIURETIC PEPTIDE -  Abnormal; Notable for the following components:   B Natriuretic Peptide 125.9 (*)    All other components within normal limits  CBG MONITORING, ED - Abnormal; Notable for the following components:   Glucose-Capillary 115 (*)    All other components within normal limits  CBC WITH DIFFERENTIAL/PLATELET    EKG EKG Interpretation  Date/Time:  Thursday April 03 2021 21:44:26 EST Ventricular Rate:  100 PR Interval:  148 QRS Duration: 78 QT Interval:  316 QTC Calculation: 407 R Axis:   98 Text Interpretation: Normal sinus rhythm Rightward axis Low voltage QRS Cannot rule out Anterior infarct , age undetermined Abnormal ECG Confirmed by Quintella Reichert [phone]) on 04/03/2021 10:57:06 PM  Radiology DG Chest Port 1 View  Result Date: 04/03/2021 CLINICAL DATA:  Shortness of breath. EXAM: PORTABLE CHEST 1 VIEW COMPARISON:  Chest radiograph dated 02/27/2019. FINDINGS: There is mild cardiomegaly. Bilateral mid to lower lung field densities may be chronic and scarring. Developing infiltrate is less likely. No focal consolidation, pleural effusion, or pneumothorax. No acute osseous pathology. IMPRESSION: 1. No focal consolidation. 2. Cardiomegaly. Electronically Signed   By: Anner Crete M.D.   On: 04/03/2021 23:44    Procedures Procedures   Medications Ordered in ED Medications  albuterol (VENTOLIN HFA) 108 (90 Base) MCG/ACT inhaler 2 puff (has no administration in time range)    ED Course  I have reviewed the triage vital signs and the nursing notes.  Pertinent labs & imaging results that were available during my care of the patient were reviewed by me and  considered in my medical decision making (see chart for details).    MDM Rules/Calculators/A&P                         Patient here for evaluation of shortness of breath, fatigue, nasal congestion.  There are household contacts that are currently sick with URI symptoms.  She is nontoxic-appearing on evaluation with no respiratory distress.  She was on supplemental oxygen when evaluated but when this was removed she did not have oxygen requirement.  Labs with stable renal insufficiency, mild elevation in BNP, similar when compared to priors.  Chest x-ray without any evidence of acute pneumonia.  Presentation is not consistent with sepsis, PE, decompensated CHF.  She is positive for COVID-19 today.  Given that she is early into her onset of symptoms feel she is a pack Svea candidate at a reduced dose due to her renal insufficiency.  Plan to discharge home on oral antivirals, outpatient follow-up and return precautions.    Final Clinical Impression(s) / ED Diagnoses Final diagnoses:  COVID-19 virus infection    Rx / DC Orders ED Discharge Orders          Ordered    nirmatrelvir/ritonavir EUA, renal dosing, (PAXLOVID) 10 x 150 MG & 10 x 100MG  TABS  2 times daily,   Status:  Discontinued        04/04/21 0629    nirmatrelvir/ritonavir EUA, renal dosing, (PAXLOVID) 10 x 150 MG & 10 x 100MG  TABS  2 times daily        04/04/21 6060             Quintella Reichert, MD 04/04/21 [phone]

## 2021-04-03 NOTE — ED Triage Notes (Signed)
Pt arrives POV for eval of SOB onset this AM. Pt does not answer many questions in triage, and is poor historian. Denies associated CP, N/V, epigastric pain. Denies known sick contacts.

## 2021-04-04 LAB — RESP PANEL BY RT-PCR (FLU A&B, COVID) ARPGX2
Influenza A by PCR: NEGATIVE
Influenza B by PCR: NEGATIVE
SARS Coronavirus 2 by RT PCR: POSITIVE — AB

## 2021-04-04 LAB — BRAIN NATRIURETIC PEPTIDE: B Natriuretic Peptide: 125.9 pg/mL — ABNORMAL HIGH (ref 0.0–100.0)

## 2021-04-04 MED ORDER — NIRMATRELVIR/RITONAVIR (PAXLOVID) TABLET (RENAL DOSING): 2.0000 | ORAL_TABLET | Freq: Two times a day (BID) | ORAL | 0 refills | Status: DC

## 2021-04-04 MED ORDER — NIRMATRELVIR/RITONAVIR (PAXLOVID) TABLET (RENAL DOSING)
2.0000 | ORAL_TABLET | Freq: Two times a day (BID) | ORAL | 0 refills | Status: AC
Start: 2021-04-04 — End: 2021-04-09

## 2021-04-04 NOTE — ED Notes (Signed)
Patient verbalizes understanding of discharge instructions. Prescriptions and follow-up care reviewed. Opportunity for questioning and answers were provided. Armband removed by staff, pt discharged from ED ambulatory to wheelchair. Pt wheeled out to the car with family member.

## 2021-04-04 NOTE — ED Notes (Signed)
Pt ambulated, pt was very sleepy and slow while ambulating. O2 lowest reading was 90%. PT denied any SOB with ambulation.

## 2021-06-26 DIAGNOSIS — Z20822 Contact with and (suspected) exposure to covid-19: Secondary | ICD-10-CM | POA: Diagnosis not present

## 2021-08-17 DIAGNOSIS — Z20822 Contact with and (suspected) exposure to covid-19: Secondary | ICD-10-CM | POA: Diagnosis not present

## 2021-08-19 DIAGNOSIS — E1169 Type 2 diabetes mellitus with other specified complication: Secondary | ICD-10-CM | POA: Diagnosis not present

## 2021-08-19 DIAGNOSIS — F320 Major depressive disorder, single episode, mild: Secondary | ICD-10-CM | POA: Diagnosis not present

## 2021-08-19 DIAGNOSIS — E7800 Pure hypercholesterolemia, unspecified: Secondary | ICD-10-CM | POA: Diagnosis not present

## 2021-08-19 DIAGNOSIS — M150 Primary generalized (osteo)arthritis: Secondary | ICD-10-CM | POA: Diagnosis not present

## 2021-08-19 DIAGNOSIS — I10 Essential (primary) hypertension: Secondary | ICD-10-CM | POA: Diagnosis not present

## 2021-08-26 DIAGNOSIS — Z20822 Contact with and (suspected) exposure to covid-19: Secondary | ICD-10-CM | POA: Diagnosis not present

## 2021-10-14 DIAGNOSIS — E119 Type 2 diabetes mellitus without complications: Secondary | ICD-10-CM | POA: Diagnosis not present

## 2021-10-14 DIAGNOSIS — H353134 Nonexudative age-related macular degeneration, bilateral, advanced atrophic with subfoveal involvement: Secondary | ICD-10-CM | POA: Diagnosis not present

## 2021-10-14 DIAGNOSIS — Z961 Presence of intraocular lens: Secondary | ICD-10-CM | POA: Diagnosis not present

## 2021-10-14 DIAGNOSIS — H401134 Primary open-angle glaucoma, bilateral, indeterminate stage: Secondary | ICD-10-CM | POA: Diagnosis not present

## 2021-10-14 DIAGNOSIS — H35341 Macular cyst, hole, or pseudohole, right eye: Secondary | ICD-10-CM | POA: Diagnosis not present

## 2022-02-04 DIAGNOSIS — R29890 Loss of height: Secondary | ICD-10-CM | POA: Diagnosis not present

## 2022-02-04 DIAGNOSIS — Z1231 Encounter for screening mammogram for malignant neoplasm of breast: Secondary | ICD-10-CM | POA: Diagnosis not present

## 2022-02-04 DIAGNOSIS — N958 Other specified menopausal and perimenopausal disorders: Secondary | ICD-10-CM | POA: Diagnosis not present

## 2022-02-04 DIAGNOSIS — Z6841 Body Mass Index (BMI) 40.0 and over, adult: Secondary | ICD-10-CM | POA: Diagnosis not present

## 2022-02-04 DIAGNOSIS — Z01419 Encounter for gynecological examination (general) (routine) without abnormal findings: Secondary | ICD-10-CM | POA: Diagnosis not present

## 2022-02-04 DIAGNOSIS — Z13820 Encounter for screening for osteoporosis: Secondary | ICD-10-CM | POA: Diagnosis not present

## 2022-02-04 DIAGNOSIS — M8588 Other specified disorders of bone density and structure, other site: Secondary | ICD-10-CM | POA: Diagnosis not present

## 2022-02-17 DIAGNOSIS — E7800 Pure hypercholesterolemia, unspecified: Secondary | ICD-10-CM | POA: Diagnosis not present

## 2022-02-17 DIAGNOSIS — E1169 Type 2 diabetes mellitus with other specified complication: Secondary | ICD-10-CM | POA: Diagnosis not present

## 2022-02-17 DIAGNOSIS — I10 Essential (primary) hypertension: Secondary | ICD-10-CM | POA: Diagnosis not present

## 2022-02-17 DIAGNOSIS — J309 Allergic rhinitis, unspecified: Secondary | ICD-10-CM | POA: Diagnosis not present

## 2022-02-17 DIAGNOSIS — F320 Major depressive disorder, single episode, mild: Secondary | ICD-10-CM | POA: Diagnosis not present

## 2022-02-17 DIAGNOSIS — Z23 Encounter for immunization: Secondary | ICD-10-CM | POA: Diagnosis not present

## 2022-02-17 DIAGNOSIS — L719 Rosacea, unspecified: Secondary | ICD-10-CM | POA: Diagnosis not present

## 2022-02-17 DIAGNOSIS — Z0000 Encounter for general adult medical examination without abnormal findings: Secondary | ICD-10-CM | POA: Diagnosis not present

## 2022-08-18 DIAGNOSIS — F320 Major depressive disorder, single episode, mild: Secondary | ICD-10-CM | POA: Diagnosis not present

## 2022-08-18 DIAGNOSIS — E7800 Pure hypercholesterolemia, unspecified: Secondary | ICD-10-CM | POA: Diagnosis not present

## 2022-08-18 DIAGNOSIS — E1169 Type 2 diabetes mellitus with other specified complication: Secondary | ICD-10-CM | POA: Diagnosis not present

## 2022-08-18 DIAGNOSIS — R0981 Nasal congestion: Secondary | ICD-10-CM | POA: Diagnosis not present

## 2022-08-18 DIAGNOSIS — I10 Essential (primary) hypertension: Secondary | ICD-10-CM | POA: Diagnosis not present

## 2022-08-18 DIAGNOSIS — M150 Primary generalized (osteo)arthritis: Secondary | ICD-10-CM | POA: Diagnosis not present

## 2022-10-13 DIAGNOSIS — H401134 Primary open-angle glaucoma, bilateral, indeterminate stage: Secondary | ICD-10-CM | POA: Diagnosis not present

## 2022-10-13 DIAGNOSIS — H35341 Macular cyst, hole, or pseudohole, right eye: Secondary | ICD-10-CM | POA: Diagnosis not present

## 2022-10-13 DIAGNOSIS — E119 Type 2 diabetes mellitus without complications: Secondary | ICD-10-CM | POA: Diagnosis not present

## 2022-10-13 DIAGNOSIS — H353134 Nonexudative age-related macular degeneration, bilateral, advanced atrophic with subfoveal involvement: Secondary | ICD-10-CM | POA: Diagnosis not present

## 2022-10-13 DIAGNOSIS — Z961 Presence of intraocular lens: Secondary | ICD-10-CM | POA: Diagnosis not present

## 2022-10-13 DIAGNOSIS — Z7984 Long term (current) use of oral hypoglycemic drugs: Secondary | ICD-10-CM | POA: Diagnosis not present

## 2023-02-15 DIAGNOSIS — L720 Epidermal cyst: Secondary | ICD-10-CM | POA: Diagnosis not present

## 2023-02-23 DIAGNOSIS — R200 Anesthesia of skin: Secondary | ICD-10-CM | POA: Diagnosis not present

## 2023-02-23 DIAGNOSIS — E1169 Type 2 diabetes mellitus with other specified complication: Secondary | ICD-10-CM | POA: Diagnosis not present

## 2023-02-23 DIAGNOSIS — F320 Major depressive disorder, single episode, mild: Secondary | ICD-10-CM | POA: Diagnosis not present

## 2023-02-23 DIAGNOSIS — N1832 Chronic kidney disease, stage 3b: Secondary | ICD-10-CM | POA: Diagnosis not present

## 2023-02-23 DIAGNOSIS — I10 Essential (primary) hypertension: Secondary | ICD-10-CM | POA: Diagnosis not present

## 2023-02-23 DIAGNOSIS — Z23 Encounter for immunization: Secondary | ICD-10-CM | POA: Diagnosis not present

## 2023-02-23 DIAGNOSIS — L719 Rosacea, unspecified: Secondary | ICD-10-CM | POA: Diagnosis not present

## 2023-02-23 DIAGNOSIS — Z0000 Encounter for general adult medical examination without abnormal findings: Secondary | ICD-10-CM | POA: Diagnosis not present

## 2023-02-23 DIAGNOSIS — R600 Localized edema: Secondary | ICD-10-CM | POA: Diagnosis not present

## 2023-02-23 DIAGNOSIS — E7800 Pure hypercholesterolemia, unspecified: Secondary | ICD-10-CM | POA: Diagnosis not present

## 2023-02-23 DIAGNOSIS — R251 Tremor, unspecified: Secondary | ICD-10-CM | POA: Diagnosis not present

## 2023-03-11 DIAGNOSIS — N179 Acute kidney failure, unspecified: Secondary | ICD-10-CM | POA: Diagnosis not present

## 2023-04-01 DIAGNOSIS — N1832 Chronic kidney disease, stage 3b: Secondary | ICD-10-CM | POA: Diagnosis not present

## 2023-04-01 DIAGNOSIS — E1169 Type 2 diabetes mellitus with other specified complication: Secondary | ICD-10-CM | POA: Diagnosis not present

## 2023-04-01 DIAGNOSIS — R32 Unspecified urinary incontinence: Secondary | ICD-10-CM | POA: Diagnosis not present

## 2023-04-01 DIAGNOSIS — N898 Other specified noninflammatory disorders of vagina: Secondary | ICD-10-CM | POA: Diagnosis not present

## 2023-05-18 DIAGNOSIS — Z09 Encounter for follow-up examination after completed treatment for conditions other than malignant neoplasm: Secondary | ICD-10-CM | POA: Diagnosis not present

## 2023-05-18 DIAGNOSIS — Z860100 Personal history of colon polyps, unspecified: Secondary | ICD-10-CM | POA: Diagnosis not present

## 2023-05-18 DIAGNOSIS — Z800 Family history of malignant neoplasm of digestive organs: Secondary | ICD-10-CM | POA: Diagnosis not present

## 2023-05-20 DIAGNOSIS — I129 Hypertensive chronic kidney disease with stage 1 through stage 4 chronic kidney disease, or unspecified chronic kidney disease: Secondary | ICD-10-CM | POA: Diagnosis not present

## 2023-05-20 DIAGNOSIS — N179 Acute kidney failure, unspecified: Secondary | ICD-10-CM | POA: Diagnosis not present

## 2023-05-20 DIAGNOSIS — N1831 Chronic kidney disease, stage 3a: Secondary | ICD-10-CM | POA: Diagnosis not present

## 2023-05-20 DIAGNOSIS — E785 Hyperlipidemia, unspecified: Secondary | ICD-10-CM | POA: Diagnosis not present

## 2023-05-20 DIAGNOSIS — E1122 Type 2 diabetes mellitus with diabetic chronic kidney disease: Secondary | ICD-10-CM | POA: Diagnosis not present

## 2023-05-20 DIAGNOSIS — E1129 Type 2 diabetes mellitus with other diabetic kidney complication: Secondary | ICD-10-CM | POA: Diagnosis not present

## 2023-05-21 ENCOUNTER — Other Ambulatory Visit: Payer: Self-pay | Admitting: Internal Medicine

## 2023-05-21 DIAGNOSIS — N1831 Chronic kidney disease, stage 3a: Secondary | ICD-10-CM

## 2023-05-25 ENCOUNTER — Ambulatory Visit
Admission: RE | Admit: 2023-05-25 | Discharge: 2023-05-25 | Disposition: A | Discharge status: Home / Self Care | Payer: Medicare Other | Source: Ambulatory Visit | Attending: Internal Medicine | Admitting: Internal Medicine

## 2023-05-25 DIAGNOSIS — N1831 Chronic kidney disease, stage 3a: Secondary | ICD-10-CM | POA: Diagnosis not present

## 2023-06-08 DIAGNOSIS — R1110 Vomiting, unspecified: Secondary | ICD-10-CM | POA: Diagnosis not present

## 2023-06-08 DIAGNOSIS — E118 Type 2 diabetes mellitus with unspecified complications: Secondary | ICD-10-CM | POA: Diagnosis not present

## 2023-06-08 DIAGNOSIS — Z20822 Contact with and (suspected) exposure to covid-19: Secondary | ICD-10-CM | POA: Diagnosis not present

## 2023-06-08 DIAGNOSIS — J069 Acute upper respiratory infection, unspecified: Secondary | ICD-10-CM | POA: Diagnosis not present

## 2023-06-08 DIAGNOSIS — R519 Headache, unspecified: Secondary | ICD-10-CM | POA: Diagnosis not present

## 2023-06-11 DIAGNOSIS — E1169 Type 2 diabetes mellitus with other specified complication: Secondary | ICD-10-CM | POA: Diagnosis not present

## 2023-06-11 DIAGNOSIS — I9589 Other hypotension: Secondary | ICD-10-CM | POA: Diagnosis not present

## 2023-06-11 DIAGNOSIS — J988 Other specified respiratory disorders: Secondary | ICD-10-CM | POA: Diagnosis not present

## 2023-06-14 DIAGNOSIS — I9589 Other hypotension: Secondary | ICD-10-CM | POA: Diagnosis not present

## 2023-06-14 DIAGNOSIS — J988 Other specified respiratory disorders: Secondary | ICD-10-CM | POA: Diagnosis not present

## 2023-06-14 DIAGNOSIS — E1169 Type 2 diabetes mellitus with other specified complication: Secondary | ICD-10-CM | POA: Diagnosis not present

## 2023-06-14 DIAGNOSIS — F411 Generalized anxiety disorder: Secondary | ICD-10-CM | POA: Diagnosis not present

## 2023-07-05 DIAGNOSIS — N1832 Chronic kidney disease, stage 3b: Secondary | ICD-10-CM | POA: Diagnosis not present

## 2023-07-05 DIAGNOSIS — R0981 Nasal congestion: Secondary | ICD-10-CM | POA: Diagnosis not present

## 2023-07-05 DIAGNOSIS — Z03818 Encounter for observation for suspected exposure to other biological agents ruled out: Secondary | ICD-10-CM | POA: Diagnosis not present

## 2023-07-05 DIAGNOSIS — R609 Edema, unspecified: Secondary | ICD-10-CM | POA: Diagnosis not present

## 2023-07-05 DIAGNOSIS — R32 Unspecified urinary incontinence: Secondary | ICD-10-CM | POA: Diagnosis not present

## 2023-07-05 DIAGNOSIS — E1169 Type 2 diabetes mellitus with other specified complication: Secondary | ICD-10-CM | POA: Diagnosis not present

## 2023-07-13 DIAGNOSIS — E1129 Type 2 diabetes mellitus with other diabetic kidney complication: Secondary | ICD-10-CM | POA: Diagnosis not present

## 2023-07-13 DIAGNOSIS — I129 Hypertensive chronic kidney disease with stage 1 through stage 4 chronic kidney disease, or unspecified chronic kidney disease: Secondary | ICD-10-CM | POA: Diagnosis not present

## 2023-07-13 DIAGNOSIS — E1122 Type 2 diabetes mellitus with diabetic chronic kidney disease: Secondary | ICD-10-CM | POA: Diagnosis not present

## 2023-07-13 DIAGNOSIS — N179 Acute kidney failure, unspecified: Secondary | ICD-10-CM | POA: Diagnosis not present

## 2023-07-13 DIAGNOSIS — N1831 Chronic kidney disease, stage 3a: Secondary | ICD-10-CM | POA: Diagnosis not present

## 2023-07-13 DIAGNOSIS — E785 Hyperlipidemia, unspecified: Secondary | ICD-10-CM | POA: Diagnosis not present

## 2023-07-23 DIAGNOSIS — N1832 Chronic kidney disease, stage 3b: Secondary | ICD-10-CM | POA: Diagnosis not present

## 2023-07-23 DIAGNOSIS — I10 Essential (primary) hypertension: Secondary | ICD-10-CM | POA: Diagnosis not present

## 2023-07-23 DIAGNOSIS — E1169 Type 2 diabetes mellitus with other specified complication: Secondary | ICD-10-CM | POA: Diagnosis not present

## 2023-08-09 DIAGNOSIS — M79672 Pain in left foot: Secondary | ICD-10-CM | POA: Diagnosis not present

## 2023-08-18 DIAGNOSIS — N1832 Chronic kidney disease, stage 3b: Secondary | ICD-10-CM | POA: Diagnosis not present

## 2023-08-18 DIAGNOSIS — H401130 Primary open-angle glaucoma, bilateral, stage unspecified: Secondary | ICD-10-CM | POA: Diagnosis not present

## 2023-08-18 DIAGNOSIS — E7800 Pure hypercholesterolemia, unspecified: Secondary | ICD-10-CM | POA: Diagnosis not present

## 2023-08-18 DIAGNOSIS — E1169 Type 2 diabetes mellitus with other specified complication: Secondary | ICD-10-CM | POA: Diagnosis not present

## 2023-08-18 DIAGNOSIS — I10 Essential (primary) hypertension: Secondary | ICD-10-CM | POA: Diagnosis not present

## 2023-08-21 DIAGNOSIS — E1169 Type 2 diabetes mellitus with other specified complication: Secondary | ICD-10-CM | POA: Diagnosis not present

## 2023-08-21 DIAGNOSIS — N1832 Chronic kidney disease, stage 3b: Secondary | ICD-10-CM | POA: Diagnosis not present

## 2023-08-21 DIAGNOSIS — I10 Essential (primary) hypertension: Secondary | ICD-10-CM | POA: Diagnosis not present

## 2023-08-30 DIAGNOSIS — N1831 Chronic kidney disease, stage 3a: Secondary | ICD-10-CM | POA: Diagnosis not present

## 2023-09-18 DIAGNOSIS — H401130 Primary open-angle glaucoma, bilateral, stage unspecified: Secondary | ICD-10-CM | POA: Diagnosis not present

## 2023-09-18 DIAGNOSIS — I10 Essential (primary) hypertension: Secondary | ICD-10-CM | POA: Diagnosis not present

## 2023-09-18 DIAGNOSIS — E1169 Type 2 diabetes mellitus with other specified complication: Secondary | ICD-10-CM | POA: Diagnosis not present

## 2023-09-18 DIAGNOSIS — E7800 Pure hypercholesterolemia, unspecified: Secondary | ICD-10-CM | POA: Diagnosis not present

## 2023-09-18 DIAGNOSIS — N1832 Chronic kidney disease, stage 3b: Secondary | ICD-10-CM | POA: Diagnosis not present

## 2023-09-23 ENCOUNTER — Ambulatory Visit (HOSPITAL_COMMUNITY)
Admission: RE | Admit: 2023-09-23 | Discharge: 2023-09-23 | Disposition: A | Discharge status: Home / Self Care | Source: Ambulatory Visit | Attending: Internal Medicine | Admitting: Internal Medicine

## 2023-09-23 ENCOUNTER — Other Ambulatory Visit (HOSPITAL_COMMUNITY): Payer: Self-pay | Admitting: Internal Medicine

## 2023-09-23 DIAGNOSIS — R600 Localized edema: Secondary | ICD-10-CM

## 2023-09-23 DIAGNOSIS — N1832 Chronic kidney disease, stage 3b: Secondary | ICD-10-CM | POA: Diagnosis not present

## 2023-09-23 DIAGNOSIS — E1169 Type 2 diabetes mellitus with other specified complication: Secondary | ICD-10-CM | POA: Diagnosis not present

## 2023-09-23 DIAGNOSIS — F411 Generalized anxiety disorder: Secondary | ICD-10-CM | POA: Diagnosis not present

## 2023-09-23 DIAGNOSIS — H401130 Primary open-angle glaucoma, bilateral, stage unspecified: Secondary | ICD-10-CM | POA: Diagnosis not present

## 2023-09-23 DIAGNOSIS — Z6841 Body Mass Index (BMI) 40.0 and over, adult: Secondary | ICD-10-CM | POA: Diagnosis not present

## 2023-09-23 DIAGNOSIS — E7800 Pure hypercholesterolemia, unspecified: Secondary | ICD-10-CM | POA: Diagnosis not present

## 2023-09-23 DIAGNOSIS — L03119 Cellulitis of unspecified part of limb: Secondary | ICD-10-CM | POA: Diagnosis not present

## 2023-09-23 DIAGNOSIS — I10 Essential (primary) hypertension: Secondary | ICD-10-CM | POA: Diagnosis not present

## 2023-10-27 DIAGNOSIS — E7800 Pure hypercholesterolemia, unspecified: Secondary | ICD-10-CM | POA: Diagnosis not present

## 2023-10-27 DIAGNOSIS — Z87891 Personal history of nicotine dependence: Secondary | ICD-10-CM | POA: Diagnosis not present

## 2023-10-27 DIAGNOSIS — F339 Major depressive disorder, recurrent, unspecified: Secondary | ICD-10-CM | POA: Diagnosis not present

## 2023-10-27 DIAGNOSIS — Z8616 Personal history of COVID-19: Secondary | ICD-10-CM | POA: Diagnosis not present

## 2023-10-27 DIAGNOSIS — Z7984 Long term (current) use of oral hypoglycemic drugs: Secondary | ICD-10-CM | POA: Diagnosis not present

## 2023-10-27 DIAGNOSIS — E1169 Type 2 diabetes mellitus with other specified complication: Secondary | ICD-10-CM | POA: Diagnosis not present

## 2023-10-27 DIAGNOSIS — E1122 Type 2 diabetes mellitus with diabetic chronic kidney disease: Secondary | ICD-10-CM | POA: Diagnosis not present

## 2023-10-27 DIAGNOSIS — F411 Generalized anxiety disorder: Secondary | ICD-10-CM | POA: Diagnosis not present

## 2023-10-27 DIAGNOSIS — Z556 Problems related to health literacy: Secondary | ICD-10-CM | POA: Diagnosis not present

## 2023-10-27 DIAGNOSIS — I129 Hypertensive chronic kidney disease with stage 1 through stage 4 chronic kidney disease, or unspecified chronic kidney disease: Secondary | ICD-10-CM | POA: Diagnosis not present

## 2023-10-27 DIAGNOSIS — Z7985 Long-term (current) use of injectable non-insulin antidiabetic drugs: Secondary | ICD-10-CM | POA: Diagnosis not present

## 2023-10-27 DIAGNOSIS — H3530 Unspecified macular degeneration: Secondary | ICD-10-CM | POA: Diagnosis not present

## 2023-10-27 DIAGNOSIS — Z6841 Body Mass Index (BMI) 40.0 and over, adult: Secondary | ICD-10-CM | POA: Diagnosis not present

## 2023-10-27 DIAGNOSIS — N1832 Chronic kidney disease, stage 3b: Secondary | ICD-10-CM | POA: Diagnosis not present

## 2023-10-29 DIAGNOSIS — I129 Hypertensive chronic kidney disease with stage 1 through stage 4 chronic kidney disease, or unspecified chronic kidney disease: Secondary | ICD-10-CM | POA: Diagnosis not present

## 2023-10-29 DIAGNOSIS — F339 Major depressive disorder, recurrent, unspecified: Secondary | ICD-10-CM | POA: Diagnosis not present

## 2023-10-29 DIAGNOSIS — E1122 Type 2 diabetes mellitus with diabetic chronic kidney disease: Secondary | ICD-10-CM | POA: Diagnosis not present

## 2023-10-29 DIAGNOSIS — N1832 Chronic kidney disease, stage 3b: Secondary | ICD-10-CM | POA: Diagnosis not present

## 2023-10-29 DIAGNOSIS — E7800 Pure hypercholesterolemia, unspecified: Secondary | ICD-10-CM | POA: Diagnosis not present

## 2023-10-29 DIAGNOSIS — E1169 Type 2 diabetes mellitus with other specified complication: Secondary | ICD-10-CM | POA: Diagnosis not present

## 2023-11-05 DIAGNOSIS — E1169 Type 2 diabetes mellitus with other specified complication: Secondary | ICD-10-CM | POA: Diagnosis not present

## 2023-11-05 DIAGNOSIS — I129 Hypertensive chronic kidney disease with stage 1 through stage 4 chronic kidney disease, or unspecified chronic kidney disease: Secondary | ICD-10-CM | POA: Diagnosis not present

## 2023-11-05 DIAGNOSIS — N1832 Chronic kidney disease, stage 3b: Secondary | ICD-10-CM | POA: Diagnosis not present

## 2023-11-05 DIAGNOSIS — E1122 Type 2 diabetes mellitus with diabetic chronic kidney disease: Secondary | ICD-10-CM | POA: Diagnosis not present

## 2023-11-05 DIAGNOSIS — F339 Major depressive disorder, recurrent, unspecified: Secondary | ICD-10-CM | POA: Diagnosis not present

## 2023-11-05 DIAGNOSIS — E7800 Pure hypercholesterolemia, unspecified: Secondary | ICD-10-CM | POA: Diagnosis not present

## 2023-11-12 DIAGNOSIS — F339 Major depressive disorder, recurrent, unspecified: Secondary | ICD-10-CM | POA: Diagnosis not present

## 2023-11-12 DIAGNOSIS — I129 Hypertensive chronic kidney disease with stage 1 through stage 4 chronic kidney disease, or unspecified chronic kidney disease: Secondary | ICD-10-CM | POA: Diagnosis not present

## 2023-11-12 DIAGNOSIS — E1122 Type 2 diabetes mellitus with diabetic chronic kidney disease: Secondary | ICD-10-CM | POA: Diagnosis not present

## 2023-11-12 DIAGNOSIS — E1169 Type 2 diabetes mellitus with other specified complication: Secondary | ICD-10-CM | POA: Diagnosis not present

## 2023-11-12 DIAGNOSIS — E7800 Pure hypercholesterolemia, unspecified: Secondary | ICD-10-CM | POA: Diagnosis not present

## 2023-11-12 DIAGNOSIS — N1832 Chronic kidney disease, stage 3b: Secondary | ICD-10-CM | POA: Diagnosis not present

## 2023-11-19 DIAGNOSIS — N1832 Chronic kidney disease, stage 3b: Secondary | ICD-10-CM | POA: Diagnosis not present

## 2023-11-19 DIAGNOSIS — E1169 Type 2 diabetes mellitus with other specified complication: Secondary | ICD-10-CM | POA: Diagnosis not present

## 2023-11-19 DIAGNOSIS — E7800 Pure hypercholesterolemia, unspecified: Secondary | ICD-10-CM | POA: Diagnosis not present

## 2023-11-19 DIAGNOSIS — E1122 Type 2 diabetes mellitus with diabetic chronic kidney disease: Secondary | ICD-10-CM | POA: Diagnosis not present

## 2023-11-19 DIAGNOSIS — F339 Major depressive disorder, recurrent, unspecified: Secondary | ICD-10-CM | POA: Diagnosis not present

## 2023-11-19 DIAGNOSIS — I129 Hypertensive chronic kidney disease with stage 1 through stage 4 chronic kidney disease, or unspecified chronic kidney disease: Secondary | ICD-10-CM | POA: Diagnosis not present

## 2023-12-02 DIAGNOSIS — H401134 Primary open-angle glaucoma, bilateral, indeterminate stage: Secondary | ICD-10-CM | POA: Diagnosis not present

## 2023-12-02 DIAGNOSIS — Z961 Presence of intraocular lens: Secondary | ICD-10-CM | POA: Diagnosis not present

## 2023-12-02 DIAGNOSIS — H35341 Macular cyst, hole, or pseudohole, right eye: Secondary | ICD-10-CM | POA: Diagnosis not present

## 2023-12-02 DIAGNOSIS — H353134 Nonexudative age-related macular degeneration, bilateral, advanced atrophic with subfoveal involvement: Secondary | ICD-10-CM | POA: Diagnosis not present

## 2023-12-02 DIAGNOSIS — E119 Type 2 diabetes mellitus without complications: Secondary | ICD-10-CM | POA: Diagnosis not present

## 2023-12-07 DIAGNOSIS — R296 Repeated falls: Secondary | ICD-10-CM | POA: Diagnosis not present

## 2023-12-07 DIAGNOSIS — L539 Erythematous condition, unspecified: Secondary | ICD-10-CM | POA: Diagnosis not present

## 2023-12-07 DIAGNOSIS — R251 Tremor, unspecified: Secondary | ICD-10-CM | POA: Diagnosis not present

## 2023-12-07 DIAGNOSIS — E1169 Type 2 diabetes mellitus with other specified complication: Secondary | ICD-10-CM | POA: Diagnosis not present

## 2023-12-07 DIAGNOSIS — R2689 Other abnormalities of gait and mobility: Secondary | ICD-10-CM | POA: Diagnosis not present

## 2023-12-07 DIAGNOSIS — F339 Major depressive disorder, recurrent, unspecified: Secondary | ICD-10-CM | POA: Diagnosis not present

## 2023-12-09 DIAGNOSIS — Z7985 Long-term (current) use of injectable non-insulin antidiabetic drugs: Secondary | ICD-10-CM | POA: Diagnosis not present

## 2023-12-09 DIAGNOSIS — L539 Erythematous condition, unspecified: Secondary | ICD-10-CM | POA: Diagnosis not present

## 2023-12-09 DIAGNOSIS — M8589 Other specified disorders of bone density and structure, multiple sites: Secondary | ICD-10-CM | POA: Diagnosis not present

## 2023-12-09 DIAGNOSIS — Z8616 Personal history of COVID-19: Secondary | ICD-10-CM | POA: Diagnosis not present

## 2023-12-09 DIAGNOSIS — N1832 Chronic kidney disease, stage 3b: Secondary | ICD-10-CM | POA: Diagnosis not present

## 2023-12-09 DIAGNOSIS — I129 Hypertensive chronic kidney disease with stage 1 through stage 4 chronic kidney disease, or unspecified chronic kidney disease: Secondary | ICD-10-CM | POA: Diagnosis not present

## 2023-12-09 DIAGNOSIS — Z604 Social exclusion and rejection: Secondary | ICD-10-CM | POA: Diagnosis not present

## 2023-12-09 DIAGNOSIS — E1122 Type 2 diabetes mellitus with diabetic chronic kidney disease: Secondary | ICD-10-CM | POA: Diagnosis not present

## 2023-12-09 DIAGNOSIS — F411 Generalized anxiety disorder: Secondary | ICD-10-CM | POA: Diagnosis not present

## 2023-12-09 DIAGNOSIS — R251 Tremor, unspecified: Secondary | ICD-10-CM | POA: Diagnosis not present

## 2023-12-09 DIAGNOSIS — M150 Primary generalized (osteo)arthritis: Secondary | ICD-10-CM | POA: Diagnosis not present

## 2023-12-09 DIAGNOSIS — Z87891 Personal history of nicotine dependence: Secondary | ICD-10-CM | POA: Diagnosis not present

## 2023-12-09 DIAGNOSIS — E7800 Pure hypercholesterolemia, unspecified: Secondary | ICD-10-CM | POA: Diagnosis not present

## 2023-12-09 DIAGNOSIS — Z7984 Long term (current) use of oral hypoglycemic drugs: Secondary | ICD-10-CM | POA: Diagnosis not present

## 2023-12-09 DIAGNOSIS — H3530 Unspecified macular degeneration: Secondary | ICD-10-CM | POA: Diagnosis not present

## 2023-12-09 DIAGNOSIS — Z9181 History of falling: Secondary | ICD-10-CM | POA: Diagnosis not present

## 2023-12-09 DIAGNOSIS — E1169 Type 2 diabetes mellitus with other specified complication: Secondary | ICD-10-CM | POA: Diagnosis not present

## 2023-12-09 DIAGNOSIS — R296 Repeated falls: Secondary | ICD-10-CM | POA: Diagnosis not present

## 2023-12-09 DIAGNOSIS — F339 Major depressive disorder, recurrent, unspecified: Secondary | ICD-10-CM | POA: Diagnosis not present

## 2023-12-09 DIAGNOSIS — Z6841 Body Mass Index (BMI) 40.0 and over, adult: Secondary | ICD-10-CM | POA: Diagnosis not present

## 2023-12-14 DIAGNOSIS — I129 Hypertensive chronic kidney disease with stage 1 through stage 4 chronic kidney disease, or unspecified chronic kidney disease: Secondary | ICD-10-CM | POA: Diagnosis not present

## 2023-12-14 DIAGNOSIS — Z6841 Body Mass Index (BMI) 40.0 and over, adult: Secondary | ICD-10-CM | POA: Diagnosis not present

## 2023-12-14 DIAGNOSIS — F339 Major depressive disorder, recurrent, unspecified: Secondary | ICD-10-CM | POA: Diagnosis not present

## 2023-12-14 DIAGNOSIS — E1122 Type 2 diabetes mellitus with diabetic chronic kidney disease: Secondary | ICD-10-CM | POA: Diagnosis not present

## 2023-12-14 DIAGNOSIS — N1832 Chronic kidney disease, stage 3b: Secondary | ICD-10-CM | POA: Diagnosis not present

## 2023-12-21 DIAGNOSIS — E1122 Type 2 diabetes mellitus with diabetic chronic kidney disease: Secondary | ICD-10-CM | POA: Diagnosis not present

## 2023-12-21 DIAGNOSIS — Z6841 Body Mass Index (BMI) 40.0 and over, adult: Secondary | ICD-10-CM | POA: Diagnosis not present

## 2023-12-21 DIAGNOSIS — I129 Hypertensive chronic kidney disease with stage 1 through stage 4 chronic kidney disease, or unspecified chronic kidney disease: Secondary | ICD-10-CM | POA: Diagnosis not present

## 2023-12-21 DIAGNOSIS — F339 Major depressive disorder, recurrent, unspecified: Secondary | ICD-10-CM | POA: Diagnosis not present

## 2023-12-21 DIAGNOSIS — N1832 Chronic kidney disease, stage 3b: Secondary | ICD-10-CM | POA: Diagnosis not present

## 2023-12-28 DIAGNOSIS — E1122 Type 2 diabetes mellitus with diabetic chronic kidney disease: Secondary | ICD-10-CM | POA: Diagnosis not present

## 2023-12-28 DIAGNOSIS — F339 Major depressive disorder, recurrent, unspecified: Secondary | ICD-10-CM | POA: Diagnosis not present

## 2023-12-28 DIAGNOSIS — N1832 Chronic kidney disease, stage 3b: Secondary | ICD-10-CM | POA: Diagnosis not present

## 2023-12-28 DIAGNOSIS — I129 Hypertensive chronic kidney disease with stage 1 through stage 4 chronic kidney disease, or unspecified chronic kidney disease: Secondary | ICD-10-CM | POA: Diagnosis not present

## 2023-12-28 DIAGNOSIS — Z6841 Body Mass Index (BMI) 40.0 and over, adult: Secondary | ICD-10-CM | POA: Diagnosis not present

## 2023-12-29 DIAGNOSIS — E1169 Type 2 diabetes mellitus with other specified complication: Secondary | ICD-10-CM | POA: Diagnosis not present

## 2024-01-04 DIAGNOSIS — E1122 Type 2 diabetes mellitus with diabetic chronic kidney disease: Secondary | ICD-10-CM | POA: Diagnosis not present

## 2024-01-04 DIAGNOSIS — I129 Hypertensive chronic kidney disease with stage 1 through stage 4 chronic kidney disease, or unspecified chronic kidney disease: Secondary | ICD-10-CM | POA: Diagnosis not present

## 2024-01-04 DIAGNOSIS — N1832 Chronic kidney disease, stage 3b: Secondary | ICD-10-CM | POA: Diagnosis not present

## 2024-01-04 DIAGNOSIS — F339 Major depressive disorder, recurrent, unspecified: Secondary | ICD-10-CM | POA: Diagnosis not present

## 2024-01-04 DIAGNOSIS — Z6841 Body Mass Index (BMI) 40.0 and over, adult: Secondary | ICD-10-CM | POA: Diagnosis not present

## 2024-01-08 DIAGNOSIS — Z6841 Body Mass Index (BMI) 40.0 and over, adult: Secondary | ICD-10-CM | POA: Diagnosis not present

## 2024-01-08 DIAGNOSIS — M8589 Other specified disorders of bone density and structure, multiple sites: Secondary | ICD-10-CM | POA: Diagnosis not present

## 2024-01-08 DIAGNOSIS — F339 Major depressive disorder, recurrent, unspecified: Secondary | ICD-10-CM | POA: Diagnosis not present

## 2024-01-08 DIAGNOSIS — Z8616 Personal history of COVID-19: Secondary | ICD-10-CM | POA: Diagnosis not present

## 2024-01-08 DIAGNOSIS — Z87891 Personal history of nicotine dependence: Secondary | ICD-10-CM | POA: Diagnosis not present

## 2024-01-08 DIAGNOSIS — E7800 Pure hypercholesterolemia, unspecified: Secondary | ICD-10-CM | POA: Diagnosis not present

## 2024-01-08 DIAGNOSIS — Z7984 Long term (current) use of oral hypoglycemic drugs: Secondary | ICD-10-CM | POA: Diagnosis not present

## 2024-01-08 DIAGNOSIS — Z7985 Long-term (current) use of injectable non-insulin antidiabetic drugs: Secondary | ICD-10-CM | POA: Diagnosis not present

## 2024-01-08 DIAGNOSIS — L539 Erythematous condition, unspecified: Secondary | ICD-10-CM | POA: Diagnosis not present

## 2024-01-08 DIAGNOSIS — I129 Hypertensive chronic kidney disease with stage 1 through stage 4 chronic kidney disease, or unspecified chronic kidney disease: Secondary | ICD-10-CM | POA: Diagnosis not present

## 2024-01-08 DIAGNOSIS — E1169 Type 2 diabetes mellitus with other specified complication: Secondary | ICD-10-CM | POA: Diagnosis not present

## 2024-01-08 DIAGNOSIS — H3530 Unspecified macular degeneration: Secondary | ICD-10-CM | POA: Diagnosis not present

## 2024-01-08 DIAGNOSIS — Z604 Social exclusion and rejection: Secondary | ICD-10-CM | POA: Diagnosis not present

## 2024-01-08 DIAGNOSIS — R251 Tremor, unspecified: Secondary | ICD-10-CM | POA: Diagnosis not present

## 2024-01-08 DIAGNOSIS — M150 Primary generalized (osteo)arthritis: Secondary | ICD-10-CM | POA: Diagnosis not present

## 2024-01-08 DIAGNOSIS — Z9181 History of falling: Secondary | ICD-10-CM | POA: Diagnosis not present

## 2024-01-08 DIAGNOSIS — N1832 Chronic kidney disease, stage 3b: Secondary | ICD-10-CM | POA: Diagnosis not present

## 2024-01-08 DIAGNOSIS — F411 Generalized anxiety disorder: Secondary | ICD-10-CM | POA: Diagnosis not present

## 2024-01-08 DIAGNOSIS — R296 Repeated falls: Secondary | ICD-10-CM | POA: Diagnosis not present

## 2024-01-08 DIAGNOSIS — E1122 Type 2 diabetes mellitus with diabetic chronic kidney disease: Secondary | ICD-10-CM | POA: Diagnosis not present

## 2024-01-10 DIAGNOSIS — N1832 Chronic kidney disease, stage 3b: Secondary | ICD-10-CM | POA: Diagnosis not present

## 2024-01-10 DIAGNOSIS — F339 Major depressive disorder, recurrent, unspecified: Secondary | ICD-10-CM | POA: Diagnosis not present

## 2024-01-10 DIAGNOSIS — E1122 Type 2 diabetes mellitus with diabetic chronic kidney disease: Secondary | ICD-10-CM | POA: Diagnosis not present

## 2024-01-10 DIAGNOSIS — Z6841 Body Mass Index (BMI) 40.0 and over, adult: Secondary | ICD-10-CM | POA: Diagnosis not present

## 2024-01-10 DIAGNOSIS — I129 Hypertensive chronic kidney disease with stage 1 through stage 4 chronic kidney disease, or unspecified chronic kidney disease: Secondary | ICD-10-CM | POA: Diagnosis not present

## 2024-01-12 DIAGNOSIS — I872 Venous insufficiency (chronic) (peripheral): Secondary | ICD-10-CM | POA: Diagnosis not present

## 2024-01-12 DIAGNOSIS — L720 Epidermal cyst: Secondary | ICD-10-CM | POA: Diagnosis not present

## 2024-01-12 DIAGNOSIS — L308 Other specified dermatitis: Secondary | ICD-10-CM | POA: Diagnosis not present

## 2024-01-17 DIAGNOSIS — F339 Major depressive disorder, recurrent, unspecified: Secondary | ICD-10-CM | POA: Diagnosis not present

## 2024-01-17 DIAGNOSIS — E1122 Type 2 diabetes mellitus with diabetic chronic kidney disease: Secondary | ICD-10-CM | POA: Diagnosis not present

## 2024-01-17 DIAGNOSIS — N1832 Chronic kidney disease, stage 3b: Secondary | ICD-10-CM | POA: Diagnosis not present

## 2024-01-17 DIAGNOSIS — I129 Hypertensive chronic kidney disease with stage 1 through stage 4 chronic kidney disease, or unspecified chronic kidney disease: Secondary | ICD-10-CM | POA: Diagnosis not present

## 2024-01-17 DIAGNOSIS — Z6841 Body Mass Index (BMI) 40.0 and over, adult: Secondary | ICD-10-CM | POA: Diagnosis not present

## 2024-01-24 DIAGNOSIS — N1832 Chronic kidney disease, stage 3b: Secondary | ICD-10-CM | POA: Diagnosis not present

## 2024-01-24 DIAGNOSIS — Z6841 Body Mass Index (BMI) 40.0 and over, adult: Secondary | ICD-10-CM | POA: Diagnosis not present

## 2024-01-24 DIAGNOSIS — I129 Hypertensive chronic kidney disease with stage 1 through stage 4 chronic kidney disease, or unspecified chronic kidney disease: Secondary | ICD-10-CM | POA: Diagnosis not present

## 2024-01-24 DIAGNOSIS — F339 Major depressive disorder, recurrent, unspecified: Secondary | ICD-10-CM | POA: Diagnosis not present

## 2024-01-24 DIAGNOSIS — E1122 Type 2 diabetes mellitus with diabetic chronic kidney disease: Secondary | ICD-10-CM | POA: Diagnosis not present

## 2024-02-01 DIAGNOSIS — N1832 Chronic kidney disease, stage 3b: Secondary | ICD-10-CM | POA: Diagnosis not present

## 2024-02-01 DIAGNOSIS — E1122 Type 2 diabetes mellitus with diabetic chronic kidney disease: Secondary | ICD-10-CM | POA: Diagnosis not present

## 2024-02-01 DIAGNOSIS — I129 Hypertensive chronic kidney disease with stage 1 through stage 4 chronic kidney disease, or unspecified chronic kidney disease: Secondary | ICD-10-CM | POA: Diagnosis not present

## 2024-02-01 DIAGNOSIS — F339 Major depressive disorder, recurrent, unspecified: Secondary | ICD-10-CM | POA: Diagnosis not present

## 2024-02-01 DIAGNOSIS — Z6841 Body Mass Index (BMI) 40.0 and over, adult: Secondary | ICD-10-CM | POA: Diagnosis not present

## 2024-03-14 DIAGNOSIS — F411 Generalized anxiety disorder: Secondary | ICD-10-CM | POA: Diagnosis not present

## 2024-03-14 DIAGNOSIS — N1832 Chronic kidney disease, stage 3b: Secondary | ICD-10-CM | POA: Diagnosis not present

## 2024-03-14 DIAGNOSIS — J309 Allergic rhinitis, unspecified: Secondary | ICD-10-CM | POA: Diagnosis not present

## 2024-03-14 DIAGNOSIS — F339 Major depressive disorder, recurrent, unspecified: Secondary | ICD-10-CM | POA: Diagnosis not present

## 2024-03-14 DIAGNOSIS — Z1331 Encounter for screening for depression: Secondary | ICD-10-CM | POA: Diagnosis not present

## 2024-03-14 DIAGNOSIS — Z23 Encounter for immunization: Secondary | ICD-10-CM | POA: Diagnosis not present

## 2024-03-14 DIAGNOSIS — Z0000 Encounter for general adult medical examination without abnormal findings: Secondary | ICD-10-CM | POA: Diagnosis not present

## 2024-03-14 DIAGNOSIS — E7800 Pure hypercholesterolemia, unspecified: Secondary | ICD-10-CM | POA: Diagnosis not present

## 2024-03-14 DIAGNOSIS — E1159 Type 2 diabetes mellitus with other circulatory complications: Secondary | ICD-10-CM | POA: Diagnosis not present

## 2024-03-14 DIAGNOSIS — H401130 Primary open-angle glaucoma, bilateral, stage unspecified: Secondary | ICD-10-CM | POA: Diagnosis not present

## 2024-03-14 DIAGNOSIS — E1169 Type 2 diabetes mellitus with other specified complication: Secondary | ICD-10-CM | POA: Diagnosis not present

## 2024-03-14 DIAGNOSIS — M150 Primary generalized (osteo)arthritis: Secondary | ICD-10-CM | POA: Diagnosis not present

## 2024-03-14 DIAGNOSIS — I10 Essential (primary) hypertension: Secondary | ICD-10-CM | POA: Diagnosis not present

## 2024-03-20 ENCOUNTER — Encounter: Payer: Self-pay | Admitting: Neurology

## 2024-05-22 ENCOUNTER — Ambulatory Visit: Payer: PRIVATE HEALTH INSURANCE | Admitting: Neurology
